# Patient Record
Sex: Female | Born: 1955 | Race: Black or African American | Hispanic: No | Smoking: Former smoker
Health system: Southern US, Community
[De-identification: ages and names within clinical notes are randomized; demographics above are authoritative.]

## PROBLEM LIST (undated history)

## (undated) DIAGNOSIS — R600 Localized edema: Secondary | ICD-10-CM

## (undated) DIAGNOSIS — M2550 Pain in unspecified joint: Secondary | ICD-10-CM

## (undated) DIAGNOSIS — R519 Headache, unspecified: Secondary | ICD-10-CM

## (undated) DIAGNOSIS — G4730 Sleep apnea, unspecified: Secondary | ICD-10-CM

## (undated) DIAGNOSIS — K219 Gastro-esophageal reflux disease without esophagitis: Secondary | ICD-10-CM

## (undated) DIAGNOSIS — R0602 Shortness of breath: Secondary | ICD-10-CM

## (undated) DIAGNOSIS — I10 Essential (primary) hypertension: Secondary | ICD-10-CM

## (undated) DIAGNOSIS — E739 Lactose intolerance, unspecified: Secondary | ICD-10-CM

## (undated) DIAGNOSIS — M79643 Pain in unspecified hand: Secondary | ICD-10-CM

## (undated) DIAGNOSIS — M25569 Pain in unspecified knee: Secondary | ICD-10-CM

## (undated) DIAGNOSIS — E669 Obesity, unspecified: Secondary | ICD-10-CM

## (undated) DIAGNOSIS — E559 Vitamin D deficiency, unspecified: Secondary | ICD-10-CM

## (undated) DIAGNOSIS — R12 Heartburn: Secondary | ICD-10-CM

## (undated) DIAGNOSIS — R079 Chest pain, unspecified: Secondary | ICD-10-CM

## (undated) DIAGNOSIS — E7800 Pure hypercholesterolemia, unspecified: Secondary | ICD-10-CM

## (undated) HISTORY — DX: Sleep apnea, unspecified: G47.30

## (undated) HISTORY — PX: BREAST SURGERY: SHX581

## (undated) HISTORY — DX: Essential (primary) hypertension: I10

## (undated) HISTORY — DX: Pain in unspecified joint: M25.50

## (undated) HISTORY — DX: Gastro-esophageal reflux disease without esophagitis: K21.9

## (undated) HISTORY — DX: Pain in unspecified knee: M25.569

## (undated) HISTORY — PX: CARPAL TUNNEL RELEASE: SHX101

## (undated) HISTORY — DX: Localized edema: R60.0

## (undated) HISTORY — DX: Chest pain, unspecified: R07.9

## (undated) HISTORY — DX: Vitamin D deficiency, unspecified: E55.9

## (undated) HISTORY — DX: Pain in unspecified hand: M79.643

## (undated) HISTORY — DX: Headache, unspecified: R51.9

## (undated) HISTORY — DX: Heartburn: R12

## (undated) HISTORY — DX: Shortness of breath: R06.02

## (undated) HISTORY — PX: PARATHYROIDECTOMY: SHX19

## (undated) HISTORY — DX: Lactose intolerance, unspecified: E73.9

## (undated) HISTORY — DX: Obesity, unspecified: E66.9

## (undated) HISTORY — DX: Pure hypercholesterolemia, unspecified: E78.00

## (undated) HISTORY — PX: ABDOMINAL HYSTERECTOMY: SHX81

---

## 2003-10-21 ENCOUNTER — Encounter: Admission: RE | Admit: 2003-10-21 | Discharge: 2003-10-21 | Payer: Self-pay | Admitting: Family Medicine

## 2004-03-27 ENCOUNTER — Ambulatory Visit: Payer: Self-pay | Admitting: Cardiology

## 2004-03-29 ENCOUNTER — Ambulatory Visit: Payer: Self-pay

## 2004-04-05 ENCOUNTER — Ambulatory Visit: Payer: Self-pay

## 2004-04-12 ENCOUNTER — Encounter: Admission: RE | Admit: 2004-04-12 | Discharge: 2004-04-12 | Payer: Self-pay | Admitting: Neurology

## 2004-11-26 ENCOUNTER — Encounter: Admission: RE | Admit: 2004-11-26 | Discharge: 2004-11-26 | Payer: Self-pay | Admitting: Internal Medicine

## 2005-11-28 ENCOUNTER — Encounter: Admission: RE | Admit: 2005-11-28 | Discharge: 2005-11-28 | Payer: Self-pay | Admitting: Internal Medicine

## 2006-12-09 ENCOUNTER — Encounter: Admission: RE | Admit: 2006-12-09 | Discharge: 2006-12-09 | Payer: Self-pay | Admitting: Obstetrics and Gynecology

## 2007-12-29 ENCOUNTER — Encounter: Admission: RE | Admit: 2007-12-29 | Discharge: 2007-12-29 | Payer: Self-pay | Admitting: Obstetrics and Gynecology

## 2008-12-29 ENCOUNTER — Encounter: Admission: RE | Admit: 2008-12-29 | Discharge: 2008-12-29 | Payer: Self-pay | Admitting: Obstetrics and Gynecology

## 2010-01-01 ENCOUNTER — Encounter: Admission: RE | Admit: 2010-01-01 | Discharge: 2010-01-01 | Payer: Self-pay | Admitting: Obstetrics and Gynecology

## 2010-10-23 ENCOUNTER — Other Ambulatory Visit: Payer: Self-pay | Admitting: Obstetrics and Gynecology

## 2010-10-23 DIAGNOSIS — Z1231 Encounter for screening mammogram for malignant neoplasm of breast: Secondary | ICD-10-CM

## 2011-01-04 ENCOUNTER — Ambulatory Visit: Payer: Self-pay

## 2011-01-08 ENCOUNTER — Ambulatory Visit: Payer: Self-pay

## 2011-01-14 ENCOUNTER — Ambulatory Visit
Admission: RE | Admit: 2011-01-14 | Discharge: 2011-01-14 | Disposition: A | Discharge status: Home / Self Care | Payer: No Typology Code available for payment source | Source: Ambulatory Visit | Attending: Obstetrics and Gynecology | Admitting: Obstetrics and Gynecology

## 2011-01-14 DIAGNOSIS — Z1231 Encounter for screening mammogram for malignant neoplasm of breast: Secondary | ICD-10-CM

## 2011-10-18 ENCOUNTER — Encounter: Payer: Self-pay | Admitting: *Deleted

## 2011-10-18 ENCOUNTER — Encounter: Payer: 59 | Attending: Internal Medicine | Admitting: *Deleted

## 2011-10-18 DIAGNOSIS — E669 Obesity, unspecified: Secondary | ICD-10-CM | POA: Insufficient documentation

## 2011-10-18 DIAGNOSIS — Z713 Dietary counseling and surveillance: Secondary | ICD-10-CM | POA: Insufficient documentation

## 2011-10-18 NOTE — Progress Notes (Signed)
  Medical Nutrition Therapy:  Appt start time: 0945 end time:  1045.  Assessment:  Primary concerns today: patient here for assistance with weight loss. 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 She lives alone except when her son who is a long distance trucker is home. She has been active at the [facility] but stopped going when she had a very bad cold. She has an appointment with a trainer next week to resume an exercise program there.  MEDICATIONS: see list   DIETARY INTAKE:  Usual eating pattern includes 3 meals and 1-2 snacks per day.  Everyday foods include good variety of all food groups.  Avoided foods include none stated.    24-hr recall:  B ( AM): brings to work: yogurt and fruit or granola bar OR biscuit or croissant with gravy or meat, occasionally coffee and hot chocolate mixed or hot tea with 2 pkgs Splenda Snk ( AM):  Occasionally PNB crackers or granola bar, water (3 bottels a day)  L ( PM): brings from home or brought in by drug reps: left overs, water or sweet tea Snk ( PM): not usually, occasionally M&M's or cake if brought in D ( PM): take out, or cooks: meat, starch, vegetable, water,  Snk ( PM): none (reflux, so no food right before bed) unless PNB crackers or yogurt Beverages: water, sweet tea, coffee with hot chocolate, Koolaid, unsweetened hot tea, regular soda infrequently, cranberry or mixed juices,   Usual physical activity: has joined [employer], quit when she got a bad cold, has appointment with trainer to get back on track  Estimated energy needs: 1400 calories 158 g carbohydrates 105 g protein 39 g fat  Progress Towards Goal(s):  In progress.   Nutritional Diagnosis:  NI-1.5 Excessive energy intake As related to activity level.  As evidenced by BMI of 42.5.    Intervention:  Nutrition counseling for weight loss initiated. Taught her the calorie content of the macro-nutrients, carb counting as a method of assessing her current  eating habits as well as managing her calorie intake better. Taught her how to read food labels and about resources to provide additional nutrition information about foods in restaurants. Encouraged her to follow through with her plans to increase activity level by going to the gym several days a week.  Plan: Aim for 2-3 carb choices per meal (30-45 grams) Aim for 0-1 Carb Choice per snack if hungry Consider reading food labels for total carb of foods Continue with plan to go to [facility] for assessment and a plan of action Consider purchasing Calorie KIng book for more nutrition information  Handouts given during visit include: Carb Counting and Food Label handouts Meal Plan Card Calorie Brooke [employer] Book information  Monitoring/Evaluation:  Dietary intake, exercise, reading food labels, and body weight in 4 week(s).

## 2011-10-18 NOTE — Patient Instructions (Addendum)
Plan: Aim for 2-3 carb choices per meal (30-45 grams) Aim for 0-1 Carb Choice per snack if hungry Consider reading food labels for total carb of foods Continue with plan to go to [facility] for assessment and a plan of action Consider purchasing Calorie KIng book for more nutrition information

## 2011-11-11 ENCOUNTER — Other Ambulatory Visit (HOSPITAL_COMMUNITY): Payer: Self-pay | Admitting: Obstetrics and Gynecology

## 2011-11-11 DIAGNOSIS — C8293 Follicular lymphoma, unspecified, intra-abdominal lymph nodes: Secondary | ICD-10-CM

## 2011-11-11 DIAGNOSIS — R140 Abdominal distension (gaseous): Secondary | ICD-10-CM

## 2011-11-14 ENCOUNTER — Ambulatory Visit (HOSPITAL_COMMUNITY)
Admission: RE | Admit: 2011-11-14 | Discharge: 2011-11-14 | Disposition: A | Discharge status: Home / Self Care | Payer: 59 | Source: Ambulatory Visit | Attending: Obstetrics and Gynecology | Admitting: Obstetrics and Gynecology

## 2011-11-14 DIAGNOSIS — R1904 Left lower quadrant abdominal swelling, mass and lump: Secondary | ICD-10-CM | POA: Insufficient documentation

## 2011-11-14 DIAGNOSIS — C8293 Follicular lymphoma, unspecified, intra-abdominal lymph nodes: Secondary | ICD-10-CM

## 2011-11-14 DIAGNOSIS — K429 Umbilical hernia without obstruction or gangrene: Secondary | ICD-10-CM | POA: Insufficient documentation

## 2011-11-14 MED ORDER — IOHEXOL 300 MG/ML  SOLN
100.0000 mL | Freq: Once | INTRAMUSCULAR | Status: AC | PRN
  Administered 2011-11-14: 100 mL via INTRAVENOUS

## 2011-11-21 ENCOUNTER — Encounter: Payer: 59 | Attending: Internal Medicine | Admitting: *Deleted

## 2011-11-21 ENCOUNTER — Encounter: Payer: Self-pay | Admitting: *Deleted

## 2011-11-21 DIAGNOSIS — Z713 Dietary counseling and surveillance: Secondary | ICD-10-CM | POA: Insufficient documentation

## 2011-11-21 DIAGNOSIS — E669 Obesity, unspecified: Secondary | ICD-10-CM | POA: Insufficient documentation

## 2011-11-21 NOTE — Progress Notes (Signed)
  Medical Nutrition Therapy:  Appt start time: 0845 end time:  0915.  Assessment:  Primary concerns today: patient here for assistance with weight loss follow up visit. Patient is pleased with 3 pound weight loss since last visit. She has met with the Trainer at the [facility] and has a plan for weight training as well as enjoying the spa after she works out. She still needs a plan for when she goes to gym as other commitments get in her way. She is happy with Carb Counting and is ready to add counting fat grams after today's visit.  MEDICATIONS: see list   DIETARY INTAKE:  Usual eating pattern includes 3 meals and 1-2 snacks per day.  Everyday foods include good variety of all food groups.  Avoided foods include none stated.    24-hr recall:  B ( AM): brings to work: yogurt and fruit or granola bar OR biscuit or croissant with gravy or meat, occasionally coffee and cut back on hot chocolate mixed or hot tea with 2 pkgs Splenda Snk ( AM):  Occasionally PNB crackers or granola bar, water (3 bottels a day)  L ( PM): brings from home or brought in by drug reps: left overs, no more fries if eats burger or chicken sandwich. water or sweet tea which she limits to 1 cup instead of several a day Snk ( PM): not usually, no longer M&M's or cake if brought in, has fresh fruit and a little protein from lunch D ( PM): take out, or cooks: meat, starch, vegetable, water, eating at home more often  Snk ( PM): none (reflux, so no food right before bed) unless PNB crackers or yogurt Beverages: water, sweet tea limited to 1 cup now, coffee occasionally with hot chocolate, Koolaid, unsweetened hot tea, regular soda infrequently, cranberry or mixed juices limited to 4 oz now,   Usual physical activity: has joined [employer], had appointment with trainer to get back on track. Still wants to come up with a schedule.  Estimated energy needs: 1400 calories 158 g carbohydrates 105 g protein 39 g fat  Progress Towards  Goal(s):  In progress.   Nutritional Diagnosis:  NI-1.5 Excessive energy intake As related to activity level.  As evidenced by BMI of 42.5.    Intervention:  Commended her on her successes with Carb Counting and weight loss. Discussed calorie content of fats and how to read fat grams on Food Labels. Suggested she aim for 3 Carb Choices and 3 Fat Choices per meal. Also discussed ways to get her to the gym at least once a week, by putting the planned time on her calendar.  Plan: Continue to aim for 2-3 carb choices per meal (30-45 grams) Continue to aim for 0-1 Carb Choice per snack if hungry Consider increasing awareness of fat in foods (5 grams = 1 serving of fat)  Aim for the same # of fat servings as you have Carb choices per meal Continue reading food labels for total carb and fat grams of foods Commit to Mondays every week to the gym, put date on your calendar Consider purchasing Calorie KIng book for more nutrition information  Monitoring/Evaluation:  Dietary intake, exercise, reading food labels, and body weight in 4 week(s).

## 2011-11-21 NOTE — Patient Instructions (Addendum)
Plan: Continue to aim for 2-3 carb choices per meal (30-45 grams) Continue to aim for 0-1 Carb Choice per snack if hungry Consider increasing awareness of fat in foods (5 grams = 1 serving of fat)  Aim for the same # of fat servings as you have Carb choices per meal Continue reading food labels for total carb and fat grams of foods Commit to Mondays every week to the gym, put date on your calendar Consider purchasing Calorie KIng book for more nutrition information

## 2011-12-06 ENCOUNTER — Other Ambulatory Visit: Payer: Self-pay | Admitting: Obstetrics and Gynecology

## 2011-12-06 DIAGNOSIS — Z1231 Encounter for screening mammogram for malignant neoplasm of breast: Secondary | ICD-10-CM

## 2011-12-11 ENCOUNTER — Encounter: Payer: Self-pay | Admitting: *Deleted

## 2011-12-19 ENCOUNTER — Ambulatory Visit: Payer: 59 | Admitting: *Deleted

## 2012-01-02 ENCOUNTER — Encounter: Payer: 59 | Attending: Internal Medicine | Admitting: *Deleted

## 2012-01-02 ENCOUNTER — Encounter: Payer: Self-pay | Admitting: *Deleted

## 2012-01-02 DIAGNOSIS — E669 Obesity, unspecified: Secondary | ICD-10-CM | POA: Insufficient documentation

## 2012-01-02 DIAGNOSIS — Z713 Dietary counseling and surveillance: Secondary | ICD-10-CM | POA: Insufficient documentation

## 2012-01-02 NOTE — Progress Notes (Signed)
  Medical Nutrition Therapy:  Appt start time: 1230 end time: 1300.  Assessment:  Primary concerns today: patient here for assistance with weight loss follow up visit. Knee pain is worse so no exercise possible right now. Still doing well with carb counting but states she needs help with understnading fat grams better. She is pleased with weight maintenance with decreased exercise.  MEDICATIONS: see list   DIETARY INTAKE:  Usual eating pattern includes 3 meals and 1-2 snacks per day.  Everyday foods include good variety of all food groups.  Avoided foods include none stated.    24-hr recall:  B ( AM): brings to work: yogurt and fruit or granola bar OR biscuit or croissant with gravy or meat, occasionally coffee and cut back on hot chocolate mixed or hot tea with 2 pkgs Splenda Snk ( AM):  Occasionally PNB crackers or granola bar, water (3 bottels a day)  L ( PM): brings from home or brought in by drug reps: left overs, no more fries if eats burger or chicken sandwich. water or sweet tea which she limits to 1 cup instead of several a day Snk ( PM): not usually, no longer M&M's or cake if brought in, has fresh fruit and a little protein from lunch D ( PM): take out, or cooks: meat, starch, vegetable, water, eating at home more often  Snk ( PM): none (reflux, so no food right before bed) unless PNB crackers or yogurt Beverages: water, sweet tea limited to 1 cup now, coffee occasionally with hot chocolate, Koolaid, unsweetened hot tea, regular soda infrequently, cranberry or mixed juices limited to 4 oz now,   Usual physical activity:has not been able to exercise with current knee pain  Estimated energy needs: 1400 calories 158 g carbohydrates 105 g protein 39 g fat  Progress Towards Goal(s):  In progress.   Nutritional Diagnosis:  NI-1.5 Excessive energy intake As related to activity level.  As evidenced by BMI of 42.5.    Intervention: Commended her on her success with carb counting  and reviewed concept of making decisions on fat gram serving sizes. Provided her with additional resources for identifying which foods have added fats and which ones don't. Also discussed options for increasing her activity level without hurting her knees. She seemed interested in water as well as Arm Chair exercises. Suggest she plan for weight maintenance through the holidays (no weight gain). Plan: Continue to aim for 2-3 carb choices per meal (30-45 grams) Continue to aim for 0-1 Carb Choice per snack if hungry Continue with awareness of fat in foods (5 grams = 1 serving of fat)  Aim for the same # of fat servings as you have Carb choices per meal Continue reading food labels for total carb and fat grams of foods Consider Arm Chair exercises as alternate activity until knees are better    Handouts provided:  [facility]  Arm Chair Exercise book resources  Carb Counting and Meal Planning Booklet by [employer]  Monitoring/Evaluation:  Dietary intake, exercise, reading food labels, and body weight in 4 week(s).

## 2012-01-02 NOTE — Patient Instructions (Addendum)
Plan: Continue to aim for 2-3 carb choices per meal (30-45 grams) Continue to aim for 0-1 Carb Choice per snack if hungry Continue with awareness of fat in foods (5 grams = 1 serving of fat)  Aim for the same # of fat servings as you have Carb choices per meal Continue reading food labels for total carb and fat grams of foods Consider Arm Chair exercises as alternate activity until knees are better

## 2012-01-15 ENCOUNTER — Ambulatory Visit
Admission: RE | Admit: 2012-01-15 | Discharge: 2012-01-15 | Disposition: A | Discharge status: Home / Self Care | Payer: 59 | Source: Ambulatory Visit | Attending: Obstetrics and Gynecology | Admitting: Obstetrics and Gynecology

## 2012-01-15 DIAGNOSIS — Z1231 Encounter for screening mammogram for malignant neoplasm of breast: Secondary | ICD-10-CM

## 2012-01-27 ENCOUNTER — Encounter: Payer: Self-pay | Admitting: *Deleted

## 2012-01-27 ENCOUNTER — Encounter: Payer: 59 | Attending: Internal Medicine | Admitting: *Deleted

## 2012-01-27 DIAGNOSIS — E669 Obesity, unspecified: Secondary | ICD-10-CM | POA: Insufficient documentation

## 2012-01-27 DIAGNOSIS — Z713 Dietary counseling and surveillance: Secondary | ICD-10-CM | POA: Insufficient documentation

## 2012-01-27 NOTE — Progress Notes (Signed)
  Medical Nutrition Therapy:  Appt start time: 1200 end time: 1230.  Assessment:  Primary concerns today: patient here for assistance with weight loss follow up visit. Weight is stable today with heavier clothes on. She states she attended a program recently that provided guidance on exercises for senior citizens and she was surprised at how her heart rate increased as she participated. She plans to continue these exercises daily to increase her activity level. She is comfortable with her carb counting, still struggling with how fat grams tie into her plan.  MEDICATIONS: see list   DIETARY INTAKE:  Usual eating pattern includes 3 meals and 1-2 snacks per day.  Everyday foods include good variety of all food groups.  Avoided foods include none stated.    24-hr recall:  B ( AM): brings to work: yogurt and fruit or granola bar OR biscuit or croissant with gravy or meat, occasionally coffee and cut back on hot chocolate mixed or hot tea with 2 pkgs Splenda Snk ( AM):  Occasionally PNB crackers or granola bar, water (3 bottels a day)  L ( PM): brings from home or brought in by drug reps: left overs, no more fries if eats burger or chicken sandwich. water or sweet tea which she limits to 1 cup instead of several a day Snk ( PM): not usually, no longer M&M's or cake if brought in, has fresh fruit and a little protein from lunch D ( PM): take out, or cooks: meat, starch, vegetable, water, eating at home more often  Snk ( PM): none (reflux, so no food right before bed) unless PNB crackers or yogurt Beverages: water, sweet tea limited to 1 cup now, coffee occasionally with hot chocolate, Koolaid, unsweetened hot tea, regular soda infrequently, cranberry or mixed juices limited to 4 oz now,   Usual physical activity:has not been able to exercise with current knee pain  Estimated energy needs: 1400 calories 158 g carbohydrates 105 g protein 39 g fat  Progress Towards Goal(s):  In  progress.   Nutritional Diagnosis:  NI-1.5 Excessive energy intake As related to activity level.  As evidenced by BMI of 42.5.    Intervention: Reviewed her food choices as related to meal plan, she is doing well overall. Quizzed her on her fat gram decisions to help her understand those better. She appears motivated to continue with arm chair type exercises on a daily basis.  Plan: Continue to aim for 2-3 carb choices per meal (30-45 grams) Continue to aim for 0-1 Carb Choice per snack if hungry Continue with awareness of fat in foods (5 grams = 1 serving of fat)  Aim for the same # of fat servings as you have Carb choices per meal Continue reading food labels for total carb and fat grams of foods Continue Arm Chair exercises daily that you learned at conference, increase time as able as alternate activity until knees are better   Monitoring/Evaluation:  Dietary intake, exercise, reading food labels, and body weight in 6 week(s).

## 2012-01-27 NOTE — Patient Instructions (Addendum)
Plan: Continue to aim for 2-3 carb choices per meal (30-45 grams) Continue to aim for 0-1 Carb Choice per snack if hungry Continue with awareness of fat in foods (5 grams = 1 serving of fat)  Aim for the same # of fat servings as you have Carb choices per meal Continue reading food labels for total carb and fat grams of foods Continue Arm Chair exercises daily that you learned at conference, increase time as able as alternate activity until knees are better

## 2012-03-09 ENCOUNTER — Ambulatory Visit: Payer: 59 | Admitting: *Deleted

## 2012-04-04 ENCOUNTER — Other Ambulatory Visit: Payer: Self-pay

## 2012-12-24 ENCOUNTER — Other Ambulatory Visit: Payer: Self-pay

## 2013-06-10 ENCOUNTER — Other Ambulatory Visit: Payer: Self-pay | Admitting: Nurse Practitioner

## 2013-06-10 DIAGNOSIS — Z1231 Encounter for screening mammogram for malignant neoplasm of breast: Secondary | ICD-10-CM

## 2013-06-23 ENCOUNTER — Ambulatory Visit
Admission: RE | Admit: 2013-06-23 | Discharge: 2013-06-23 | Disposition: A | Discharge status: Home / Self Care | Payer: BC Managed Care – PPO | Source: Ambulatory Visit | Attending: Nurse Practitioner | Admitting: Nurse Practitioner

## 2013-06-23 DIAGNOSIS — Z1231 Encounter for screening mammogram for malignant neoplasm of breast: Secondary | ICD-10-CM

## 2013-12-03 ENCOUNTER — Other Ambulatory Visit: Payer: Self-pay

## 2014-10-14 ENCOUNTER — Emergency Department (HOSPITAL_COMMUNITY): Payer: 59

## 2014-10-14 ENCOUNTER — Emergency Department (HOSPITAL_COMMUNITY)
Admission: EM | Admit: 2014-10-14 | Discharge: 2014-10-14 | Disposition: A | Discharge status: Home / Self Care | Payer: 59 | Attending: Emergency Medicine | Admitting: Emergency Medicine

## 2014-10-14 ENCOUNTER — Encounter (HOSPITAL_COMMUNITY): Payer: Self-pay | Admitting: *Deleted

## 2014-10-14 DIAGNOSIS — K219 Gastro-esophageal reflux disease without esophagitis: Secondary | ICD-10-CM | POA: Diagnosis not present

## 2014-10-14 DIAGNOSIS — Y9389 Activity, other specified: Secondary | ICD-10-CM | POA: Diagnosis not present

## 2014-10-14 DIAGNOSIS — Z79899 Other long term (current) drug therapy: Secondary | ICD-10-CM | POA: Diagnosis not present

## 2014-10-14 DIAGNOSIS — S52511A Displaced fracture of right radial styloid process, initial encounter for closed fracture: Secondary | ICD-10-CM | POA: Diagnosis not present

## 2014-10-14 DIAGNOSIS — Z7982 Long term (current) use of aspirin: Secondary | ICD-10-CM | POA: Diagnosis not present

## 2014-10-14 DIAGNOSIS — Y998 Other external cause status: Secondary | ICD-10-CM | POA: Diagnosis not present

## 2014-10-14 DIAGNOSIS — Y9289 Other specified places as the place of occurrence of the external cause: Secondary | ICD-10-CM | POA: Diagnosis not present

## 2014-10-14 DIAGNOSIS — Z87891 Personal history of nicotine dependence: Secondary | ICD-10-CM | POA: Insufficient documentation

## 2014-10-14 DIAGNOSIS — S6991XA Unspecified injury of right wrist, hand and finger(s), initial encounter: Secondary | ICD-10-CM | POA: Diagnosis present

## 2014-10-14 DIAGNOSIS — W010XXA Fall on same level from slipping, tripping and stumbling without subsequent striking against object, initial encounter: Secondary | ICD-10-CM | POA: Insufficient documentation

## 2014-10-14 DIAGNOSIS — E669 Obesity, unspecified: Secondary | ICD-10-CM | POA: Diagnosis not present

## 2014-10-14 DIAGNOSIS — I10 Essential (primary) hypertension: Secondary | ICD-10-CM | POA: Insufficient documentation

## 2014-10-14 MED ORDER — HYDROCODONE-ACETAMINOPHEN 5-325 MG PO TABS: 1.0000 | ORAL_TABLET | Freq: Four times a day (QID) | ORAL | Status: DC | PRN

## 2014-10-14 NOTE — Discharge Instructions (Signed)
It is important for you to follow up with the Hand Surgeon listed above.

## 2014-10-14 NOTE — ED Provider Notes (Signed)
CSN: [id number]     Arrival date & time 10/14/14  1829 History   First MD Initiated Contact with Patient 10/14/14 1841     Chief Complaint  Patient presents with  . Wrist Injury     (Consider location/radiation/quality/duration/timing/severity/associated sxs/prior Treatment) HPI Comments: Patient presents today with right wrist pain that has been constant since she tripped on a curb and fell around 3:30 PM today.  She states that she hand on an outstretched right hand when she fell.  She took three Advil immediately following the fall, which helped somewhat.  Denies hitting her head or LOC.  No other injuries.  Denies numbness or tingling.  She has some associated swelling of the right wrist.  She is left handed.  Patient is a 59 y.o. female presenting with wrist injury.  Wrist Injury   Past Medical History  Diagnosis Date  . GERD (gastroesophageal reflux disease)   . Hypertension   . Obesity    Past Surgical History  Procedure Laterality Date  . Abdominal hysterectomy    . Breast surgery      biopsy   No family history on file. Social History  Substance Use Topics  . Smoking status: Former Smoker    Quit date: 10/17/1985  . Smokeless tobacco: Never Used  . Alcohol Use: Yes     Comment: wine 4 times a year maybe   OB History    No data available     Review of Systems  All other systems reviewed and are negative.     Allergies  Review of patient's allergies indicates no known allergies.  Home Medications   Prior to Admission medications   Medication Sig Start Date End Date Taking? Authorizing Provider  aspirin 81 MG tablet Take 81 mg by mouth daily.    Historical Provider, MD  BLACK COHOSH PO Take by mouth.    Historical Provider, MD  hydrochlorothiazide (HYDRODIURIL) 25 MG tablet Take 25 mg by mouth daily.    Historical Provider, MD  ketorolac (TORADOL) 10 MG tablet Take 10 mg by mouth every 6 (six) hours as needed.    Historical Provider, MD  loratadine  (CLARITIN) 10 MG tablet Take 10 mg by mouth daily.    Historical Provider, MD  Nutritional Supplements (ESTROVEN MAXIMUM STRENGTH) TABS Take by mouth.    Historical Provider, MD  omeprazole (PRILOSEC) 20 MG capsule Take 20 mg by mouth daily.    Historical Provider, MD   BP 144/83 mmHg  Pulse 76  Temp(Src) 98.2 F (36.8 C) (Oral)  Resp 18  Ht 5\' 2"  (1.575 m)  Wt 232 lb (105.235 kg)  BMI 42.42 kg/m2  SpO2 99% Physical Exam  Constitutional: She appears well-developed and well-nourished.  HENT:  Head: Normocephalic and atraumatic.  Neck: Normal range of motion. Neck supple.  Cardiovascular: Normal rate, regular rhythm and normal heart sounds.   Pulses:      Radial pulses are 2+ on the right side, and 2+ on the left side.  Pulmonary/Chest: Effort normal and breath sounds normal.  Musculoskeletal:       Right elbow: She exhibits normal range of motion and no swelling. No tenderness found.       Right wrist: She exhibits decreased range of motion, tenderness, bony tenderness and swelling. She exhibits no deformity.       Right hand: Normal.  Neurological: She is alert.  Distal sensation of the right hand intact  Skin: Skin is warm and dry.  Psychiatric: She has a  normal mood and affect.  Nursing note and vitals reviewed.   ED Course  Procedures (including critical care time) Labs Review Labs Reviewed - No data to display  Imaging Review Dg Wrist Complete Right  10/14/2014   CLINICAL DATA:  Generalized right wrist pain and swelling. Trip and fall injury.  EXAM: RIGHT WRIST - COMPLETE 3+ VIEW  COMPARISON:  None.  FINDINGS: Degenerative changes in the STT joints and first and second carpometacarpal joints. Flattening and deformity of the scaphoid bone. This is likely degenerative. There is an acute oblique fracture across the radial styloid process with extension to the radiocarpal joint surface. Minimal displacement of the fracture fragment. Soft tissue swelling over the wrist.   IMPRESSION: Acute fracture across the radial styloid process with extension to the radiocarpal joint surface. Soft tissue swelling. Prominent degenerative changes in the carpus.   Electronically Signed   By: Lucienne Capers M.D.   On: 10/14/2014 19:00   I have personally reviewed and evaluated these images and lab results as part of my medical decision-making.   EKG Interpretation None      MDM   Final diagnoses:  None   Patient presents today with right wrist pain after falling on an outstretched hand.  Fall was mechanical.  No other injury.  Xray of wrist showing radial styloid process fracture with extension into the joint space.  Fracture closed.  Patient neurovascularly intact.  Sugar tong splint applied.  Patient given follow up with Hand Surgery.  Patient explained the importance of follow up.  Dr. Rex Kras also reviewed xray.  Patient stable for discharge.  Return precautions given.    Hyman Bible, PA-C 10/14/14 2212  Sharlett Iles, MD 10/15/14 [phone]

## 2014-10-14 NOTE — ED Notes (Signed)
Pt states she tripped and fell at 1530 today. Pain and swelling to right wrist area.

## 2015-06-28 ENCOUNTER — Other Ambulatory Visit: Payer: Self-pay

## 2015-06-28 DIAGNOSIS — Z1231 Encounter for screening mammogram for malignant neoplasm of breast: Secondary | ICD-10-CM

## 2015-07-31 ENCOUNTER — Ambulatory Visit: Payer: Self-pay

## 2015-08-07 ENCOUNTER — Ambulatory Visit
Admission: RE | Admit: 2015-08-07 | Discharge: 2015-08-07 | Disposition: A | Discharge status: Home / Self Care | Payer: BLUE CROSS/BLUE SHIELD | Source: Ambulatory Visit

## 2015-08-07 DIAGNOSIS — Z1231 Encounter for screening mammogram for malignant neoplasm of breast: Secondary | ICD-10-CM

## 2016-08-01 ENCOUNTER — Ambulatory Visit (INDEPENDENT_AMBULATORY_CARE_PROVIDER_SITE_OTHER): Payer: BLUE CROSS/BLUE SHIELD | Admitting: "Endocrinology

## 2016-08-01 ENCOUNTER — Encounter: Payer: Self-pay | Admitting: "Endocrinology

## 2016-08-01 ENCOUNTER — Other Ambulatory Visit: Payer: Self-pay

## 2016-08-01 DIAGNOSIS — Z6839 Body mass index (BMI) 39.0-39.9, adult: Secondary | ICD-10-CM | POA: Insufficient documentation

## 2016-08-01 LAB — RENAL FUNCTION PANEL
ALBUMIN: 4 g/dL (ref 3.6–5.1)
BUN: 14 mg/dL (ref 7–25)
CALCIUM: 10.6 mg/dL — AB (ref 8.6–10.4)
CO2: 27 mmol/L (ref 20–31)
Chloride: 107 mmol/L (ref 98–110)
Creat: 0.88 mg/dL (ref 0.50–0.99)
Glucose, Bld: 94 mg/dL (ref 65–99)
PHOSPHORUS: 3.5 mg/dL (ref 2.5–4.5)
Potassium: 4 mmol/L (ref 3.5–5.3)
SODIUM: 141 mmol/L (ref 135–146)

## 2016-08-01 LAB — T4, FREE: FREE T4: 1 ng/dL (ref 0.8–1.8)

## 2016-08-01 LAB — TSH: TSH: 2.23 m[IU]/L

## 2016-08-01 LAB — PHOSPHORUS: PHOSPHORUS: 3.5 mg/dL (ref 2.5–4.5)

## 2016-08-01 NOTE — Progress Notes (Signed)
HPI  Tina [name] is a 61 y.o.-year-old female, referred by her PCP, Dr.Shah, for evaluation for hypercalcemia/hyperparathyroidism.  Pt was dx with hypercalcemia for at least 2 years now. I reviewed pt's pertinent labs: - May,22,  2018: PTH 88 (normal 15-65), and calcium 11.0 - She does not have any bone density studies to review. No fractures or falls. She denies any history of seizure disorder. No h/o kidney stones. No h/o CKD.  her recent labs from May 23 showed normal renal function with BUN 12 and creatinine 0.91. Pt is on HCTZ, for hypertension. No h/o vitamin D deficiency. She does not have recent vitamin D level to review. Pt is not on calcium and vitamin D; she  is lactose intolerant does not consume a lot of dairy products . Pt does not have a FH of hypercalcemia, pituitary tumors, thyroid cancer, or osteoporosis.  - She denies any history of hight loss.  Past Medical History:  Diagnosis Date  . GERD (gastroesophageal reflux disease)   . Hypertension   . Obesity    Active Ambulatory Problems    Diagnosis Date Noted  . Hypercalcemia 08/01/2016  . Class 2 severe obesity due to excess calories with serious comorbidity and body mass index (BMI) of 39.0 to 39.9 in adult [facility]) 08/01/2016   Resolved Ambulatory Problems    Diagnosis Date Noted  . No Resolved Ambulatory Problems   Past Medical History:  Diagnosis Date  . GERD (gastroesophageal reflux disease)   . Hypertension   . Obesity     ROS:  Constitutional: + Steady weight, no fatigue, no subjective hyperthermia, no subjective hypothermia Eyes: no blurry vision, no xerophthalmia ENT: no sore throat, no nodules palpated in throat, no dysphagia/odynophagia, no hoarseness Cardiovascular: no Chest Pain, no Shortness of Breath, no palpitations, no leg swelling Respiratory: no cough, no SOB Gastrointestinal: no Nausea/Vomiting/Diarhhea Musculoskeletal: no muscle/joint aches Skin: no rashes Neurological: no tremors,  no numbness, no tingling, no dizziness Psychiatric: no depression, no anxiety  PE: BP 135/88   Pulse 66   Ht 5' 3.25" (1.607 m)   Wt 219 lb (99.3 kg)   BMI 38.49 kg/m  Wt Readings from Last 3 Encounters:  08/01/16 219 lb (99.3 kg)  10/14/14 232 lb (105.2 kg)  01/27/12 229 lb 11.2 oz (104.2 kg)   Constitutional: Obese,  not in acute distress, normal state of mind Eyes: PERRLA, EOMI, no exophthalmos ENT: moist mucous membranes, no thyromegaly, no cervical lymphadenopathy Cardiovascular: normal precordial activity, Regular Rate and Rhythm, no Murmur/Rubs/Gallops Respiratory:  adequate breathing efforts, no gross chest deformity, Clear to auscultation bilaterally Gastrointestinal: abdomen soft, Non -tender, No distension, Bowel Sounds present Musculoskeletal: no gross deformities, strength intact in all four extremities Skin: moist, warm, no rashes Neurological: no tremor with outstretched hands, Deep tendon reflexes normal in all four extremities.  On 07/09/2016: PTH 88 (normal 15-65); calcium 11.0    Assessment: 1. Hypercalcemia/hyperparathyroidism  Plan: Patient has had several instances of elevated calcium, with the highest level being at 11. A corresponding intact PTH level was also high, at 88.  - No apparent complications from hypercalcemia: no h/o nephrolithiasis, no osteoporosis, no fractures. No abdominal pain (other than related to diverticulitis/gastritis), depression, bone pain. - I discussed with the patient about the physiology of calcium and parathyroid hormone, and possible side effects from increased PTH, including kidney stones, osteoporosis, abdominal pain, etc.  - We discussed that we need to check whether her hyperparathyroidism is primary or secondary (to conditions like: vitamin D  deficiency, calcium malabsorption, hypercalciuria, renal insufficiency, etc.). -It is essential to rule out the rare  cause of hypercalcemia, FHH ( Familial  hypocalciuric  hypercalcemia)- which would not need any intervention.  - I'll proceed to obtain 24-hour urine calcium measurement, bone density, magnesium/phosphorus, 25-hydroxy vitamin D, and repeat PTH/calcium.  - If she is confirmed to have primary hyperparathyroidism , she is a surgical candidate.   - She will return in 4 weeks with her results to review and make treatment decision if necessary.   Glade Lloyd, MD

## 2016-08-02 LAB — VITAMIN D 25 HYDROXY (VIT D DEFICIENCY, FRACTURES): Vit D, 25-Hydroxy: 22 ng/mL — ABNORMAL LOW (ref 30–100)

## 2016-08-02 LAB — PTH, INTACT AND CALCIUM
Calcium: 10.6 mg/dL — ABNORMAL HIGH (ref 8.6–10.4)
PTH: 106 pg/mL — ABNORMAL HIGH (ref 14–64)

## 2016-08-02 LAB — MAGNESIUM: MAGNESIUM: 2.2 mg/dL (ref 1.5–2.5)

## 2016-08-07 ENCOUNTER — Other Ambulatory Visit: Payer: Self-pay | Admitting: "Endocrinology

## 2016-08-07 DIAGNOSIS — E213 Hyperparathyroidism, unspecified: Secondary | ICD-10-CM

## 2016-08-07 DIAGNOSIS — Z1231 Encounter for screening mammogram for malignant neoplasm of breast: Secondary | ICD-10-CM

## 2016-08-11 LAB — CALCIUM, URINE, 24 HOUR
Calcium, 24 hour urine: 366 mg/24 h — ABNORMAL HIGH (ref 35–250)
Calcium, Ur: 17 mg/dL

## 2016-09-06 ENCOUNTER — Other Ambulatory Visit: Payer: Self-pay

## 2016-09-06 ENCOUNTER — Ambulatory Visit
Admission: RE | Admit: 2016-09-06 | Discharge: 2016-09-06 | Disposition: A | Discharge status: Home / Self Care | Payer: BLUE CROSS/BLUE SHIELD | Source: Ambulatory Visit | Attending: "Endocrinology | Admitting: "Endocrinology

## 2016-09-06 DIAGNOSIS — E213 Hyperparathyroidism, unspecified: Secondary | ICD-10-CM

## 2016-09-06 DIAGNOSIS — Z1231 Encounter for screening mammogram for malignant neoplasm of breast: Secondary | ICD-10-CM

## 2016-09-16 ENCOUNTER — Encounter: Payer: Self-pay | Admitting: "Endocrinology

## 2016-09-16 ENCOUNTER — Ambulatory Visit (INDEPENDENT_AMBULATORY_CARE_PROVIDER_SITE_OTHER): Payer: BLUE CROSS/BLUE SHIELD | Admitting: "Endocrinology

## 2016-09-16 DIAGNOSIS — E210 Primary hyperparathyroidism: Secondary | ICD-10-CM | POA: Insufficient documentation

## 2016-09-16 NOTE — Progress Notes (Signed)
HPI  Tina [name] is a 60 y.o.-year-old female, referred by her PCP, Dr.Shah, for evaluation for hypercalcemia/hyperparathyroidism.  Pt was dx with hypercalcemia for at least 2 years now. I reviewed pt's pertinent labs: - May,22,  2018: PTH 88 (normal 15-65), and calcium 11.0 - Her repeat labs from 08/01/2016 showed calcium 10.6, PTH 106. -Her 24 hour urine calcium is elevated at 366. - She does  have bone density - done since her last visit showing osteopenia with T score of - 1.8 on her spine.  No fractures or falls. She denies any history of seizure disorder. No h/o kidney stones. No h/o CKD.  her recent labs from May 23 showed normal renal function with BUN 12 and creatinine 0.91. Pt is on HCTZ, for hypertension. No h/o vitamin D deficiency. Pt is not on calcium and vitamin D; she  is lactose intolerant does not consume a lot of dairy products . Pt does not have a FH of hypercalcemia, pituitary tumors, thyroid cancer, or osteoporosis.  - She denies any history of hight loss.  Past Medical History:  Diagnosis Date  . GERD (gastroesophageal reflux disease)   . Hypertension   . Obesity    Active Ambulatory Problems    Diagnosis Date Noted  . Hypercalcemia 08/01/2016  . Class 2 severe obesity due to excess calories with serious comorbidity and body mass index (BMI) of 39.0 to 39.9 in adult ([place]) 08/01/2016  . Hyperparathyroidism, primary ([place]) 09/16/2016   Resolved Ambulatory Problems    Diagnosis Date Noted  . No Resolved Ambulatory Problems   Past Medical History:  Diagnosis Date  . GERD (gastroesophageal reflux disease)   . Hypertension   . Obesity     ROS:  Constitutional: + Steady weight, no fatigue, no subjective hyperthermia, no subjective hypothermia Eyes: no blurry vision, no xerophthalmia ENT: no sore throat, no nodules palpated in throat, no dysphagia/odynophagia, no hoarseness Cardiovascular: no Chest Pain, no Shortness of Breath, no palpitations, no leg  swelling Respiratory: no cough, no SOB Gastrointestinal: no Nausea/Vomiting/Diarhhea Musculoskeletal: no muscle/joint aches Skin: no rashes Neurological: no tremors, no numbness, no tingling, no dizziness Psychiatric: no depression, no anxiety  PE: BP 121/85 (BP Location: Left Arm, Patient Position: Sitting, Cuff Size: Large)   Pulse 73   Ht 5' 3.25" (1.607 m)   Wt 220 lb 3.2 oz (99.9 kg)   SpO2 97%   BMI 38.70 kg/m  Wt Readings from Last 3 Encounters:  09/16/16 220 lb 3.2 oz (99.9 kg)  08/01/16 219 lb (99.3 kg)  10/14/14 232 lb (105.2 kg)   Constitutional: Obese,  not in acute distress, normal state of mind Eyes: PERRLA, EOMI, no exophthalmos ENT: moist mucous membranes, no thyromegaly, no cervical lymphadenopathy Cardiovascular: normal precordial activity, Regular Rate and Rhythm, no Murmur/Rubs/Gallops Respiratory:  adequate breathing efforts, no gross chest deformity, Clear to auscultation bilaterally Gastrointestinal: abdomen soft, Non -tender, No distension, Bowel Sounds present Musculoskeletal: no gross deformities, strength intact in all four extremities Skin: moist, warm, no rashes Neurological: no tremor with outstretched hands, Deep tendon reflexes normal in all four extremities.   Recent Results (from the past 2160 hour(s))  TSH     Status: None   Collection Time: 08/01/16  9:28 AM  Result Value Ref Range   TSH 2.23 mIU/L    Comment:   Reference Range   > or = 20 Years  0.40-4.50   Pregnancy Range First trimester  0.26-2.66 Second trimester 0.55-2.73 Third trimester  0.43-2.91  T4, free     Status: None   Collection Time: 08/01/16  9:28 AM  Result Value Ref Range   Free T4 1.0 0.8 - 1.8 ng/dL  PTH, intact and calcium     Status: Abnormal   Collection Time: 08/01/16  9:30 AM  Result Value Ref Range   PTH 106 (H) 14 - 64 pg/mL   Calcium 10.6 (H) 8.6 - 10.4 mg/dL    Comment:   Interpretive Guide:                              Intact PTH                Calcium                              ----------               ------- Normal Parathyroid           Normal                   Normal Hypoparathyroidism           Low or Low Normal        Low Hyperparathyroidism      Primary                 Normal or High           High      Secondary               High                     Normal or Low      Tertiary                High                     High Non-Parathyroid   Hypercalcemia              Low or Low Normal        High   Magnesium     Status: None   Collection Time: 08/01/16  9:30 AM  Result Value Ref Range   Magnesium 2.2 1.5 - 2.5 mg/dL  Phosphorus     Status: None   Collection Time: 08/01/16  9:30 AM  Result Value Ref Range   Phosphorus 3.5 2.5 - 4.5 mg/dL  VITAMIN D 25 Hydroxy (Vit-D Deficiency, Fractures)     Status: Abnormal   Collection Time: 08/01/16  9:30 AM  Result Value Ref Range   Vit D, 25-Hydroxy 22 (L) 30 - 100 ng/mL    Comment: Vitamin D Status           25-OH Vitamin D        Deficiency                <20 ng/mL        Insufficiency         20 - 29 ng/mL        Optimal             > or = 30 ng/mL   For 25-OH Vitamin D testing on patients on D2-supplementation and patients for whom quantitation of D2 and D3 fractions is required, the Quest[facility]D 25-OH VIT D, (D2,D3), LC/MS/MS is recommended: order  code [id number] (patients > 2 yrs).   Renal function panel     Status: Abnormal   Collection Time: 08/01/16  9:30 AM  Result Value Ref Range   Sodium 141 135 - 146 mmol/L   Potassium 4.0 3.5 - 5.3 mmol/L   Chloride 107 98 - 110 mmol/L   CO2 27 20 - 31 mmol/L   Glucose, Bld 94 65 - 99 mg/dL   BUN 14 7 - 25 mg/dL   Creat 0.88 0.50 - 0.99 mg/dL    Comment:   For patients > or = 61 years of age: The upper reference limit for Creatinine is approximately 13% higher for people identified as African-American.      Albumin 4.0 3.6 - 5.1 g/dL   Calcium 10.6 (H) 8.6 - 10.4 mg/dL   Phosphorus 3.5 2.5 - 4.5 mg/dL  Calcium,  urine, 24 hour     Status: Abnormal   Collection Time: 08/10/16 10:22 AM  Result Value Ref Range   Calcium, Ur 17 Not estab mg/dL   Calcium, 24 hour urine 366 (H) 35 - 250 mg/24 h    Comment:                                   Reference Range   35-250                                   Low calcium diet  35-200       On 07/09/2016: PTH 88 (normal 15-65); calcium 11.0   Assessment: 1. Hypercalcemia/hyperparathyroidism  Plan: Patient has had several instances of elevated calcium, with the highest level being at 11. A corresponding intact PTH level was also high, at 88.  -  since her last visit, repeat labs show PTH 106, calcium 10.6, 24 hour urine calcium high at 366, bone density shows osteopenia with spinal T score of -1.8. - No history of fractures. No abdominal pain (other than related to diverticulitis/gastritis), depression, bone pain. - I discussed with the patient about the physiology of calcium and parathyroid hormone, and possible side effects from increased PTH, including kidney stones, osteoporosis, abdominal pain, etc.  - She most likely has primary hyperparathyroidism. - She has early mild-primary hyperparathyroidism , she is a surgical candidate.  - I discussed and refer her to Dr. Armandina Gemma for evaluation and treatment.  - She does have mild vitamin D deficiency, will defer replacement after surgery.   - She will return in 8 weeks with   repeat PTH/calcium, thyroid function tests for reevaluation.  Glade Lloyd, MD

## 2016-10-28 ENCOUNTER — Telehealth: Payer: Self-pay

## 2016-10-28 NOTE — Telephone Encounter (Signed)
I called back, no answer; left message for her to call back.

## 2016-10-28 NOTE — Telephone Encounter (Signed)
Pt is requesting a call from [facility]. She is nervous about having surgery for hyperparathyroidism. And doesn't want to proceed until she speaks with Dr Dorris Fetch.

## 2016-10-30 ENCOUNTER — Other Ambulatory Visit (HOSPITAL_COMMUNITY): Payer: Self-pay | Admitting: Surgery

## 2016-10-30 DIAGNOSIS — E213 Hyperparathyroidism, unspecified: Secondary | ICD-10-CM

## 2016-11-22 ENCOUNTER — Encounter (HOSPITAL_COMMUNITY): Admission: RE | Admit: 2016-11-22 | Payer: BLUE CROSS/BLUE SHIELD | Source: Ambulatory Visit

## 2016-11-22 ENCOUNTER — Encounter (HOSPITAL_COMMUNITY)
Admission: RE | Admit: 2016-11-22 | Discharge: 2016-11-22 | Disposition: A | Discharge status: Home / Self Care | Payer: BLUE CROSS/BLUE SHIELD | Source: Ambulatory Visit | Attending: Surgery | Admitting: Surgery

## 2016-11-22 DIAGNOSIS — E213 Hyperparathyroidism, unspecified: Secondary | ICD-10-CM | POA: Insufficient documentation

## 2016-11-22 MED ORDER — TECHNETIUM TC 99M SESTAMIBI GENERIC - CARDIOLITE
25.0000 | Freq: Once | INTRAVENOUS | Status: AC | PRN
  Administered 2016-11-22: 25 via INTRAVENOUS

## 2016-12-04 ENCOUNTER — Other Ambulatory Visit: Payer: Self-pay | Admitting: Surgery

## 2016-12-04 DIAGNOSIS — E210 Primary hyperparathyroidism: Secondary | ICD-10-CM

## 2016-12-11 ENCOUNTER — Ambulatory Visit
Admission: RE | Admit: 2016-12-11 | Discharge: 2016-12-11 | Disposition: A | Discharge status: Home / Self Care | Payer: BLUE CROSS/BLUE SHIELD | Source: Ambulatory Visit | Attending: Surgery | Admitting: Surgery

## 2016-12-11 DIAGNOSIS — E210 Primary hyperparathyroidism: Secondary | ICD-10-CM

## 2016-12-17 ENCOUNTER — Other Ambulatory Visit: Payer: Self-pay | Admitting: Surgery

## 2016-12-17 DIAGNOSIS — E210 Primary hyperparathyroidism: Secondary | ICD-10-CM

## 2016-12-20 ENCOUNTER — Ambulatory Visit
Admission: RE | Admit: 2016-12-20 | Discharge: 2016-12-20 | Disposition: A | Discharge status: Home / Self Care | Payer: BLUE CROSS/BLUE SHIELD | Source: Ambulatory Visit | Attending: Surgery | Admitting: Surgery

## 2016-12-20 DIAGNOSIS — E210 Primary hyperparathyroidism: Secondary | ICD-10-CM

## 2016-12-20 MED ORDER — IOPAMIDOL (ISOVUE-300) INJECTION 61%
75.0000 mL | Freq: Once | INTRAVENOUS | Status: AC | PRN
  Administered 2016-12-20: 75 mL via INTRAVENOUS

## 2016-12-25 ENCOUNTER — Ambulatory Visit: Payer: Self-pay | Admitting: Surgery

## 2016-12-25 ENCOUNTER — Encounter: Payer: Self-pay | Admitting: "Endocrinology

## 2018-03-30 ENCOUNTER — Ambulatory Visit (INDEPENDENT_AMBULATORY_CARE_PROVIDER_SITE_OTHER): Payer: Self-pay | Admitting: Otolaryngology

## 2018-03-30 DIAGNOSIS — H6983 Other specified disorders of Eustachian tube, bilateral: Secondary | ICD-10-CM

## 2018-03-30 DIAGNOSIS — H900 Conductive hearing loss, bilateral: Secondary | ICD-10-CM

## 2018-03-30 DIAGNOSIS — H6121 Impacted cerumen, right ear: Secondary | ICD-10-CM

## 2018-04-30 ENCOUNTER — Ambulatory Visit (INDEPENDENT_AMBULATORY_CARE_PROVIDER_SITE_OTHER): Payer: Self-pay | Admitting: Otolaryngology

## 2018-04-30 DIAGNOSIS — H6983 Other specified disorders of Eustachian tube, bilateral: Secondary | ICD-10-CM

## 2018-08-08 IMAGING — US US THYROID
1 series · 13 of 25 positions shown · non-contrast
Comparison: Parathyroid SPECT imaging 11/22/2016

CLINICAL DATA: Primary hyperparathyroidism.

EXAM:
THYROID ULTRASOUND
TECHNIQUE: Ultrasound examination of the thyroid gland and adjacent soft
tissues was performed.

[Series 1: us thyroid · 0.06mm/px · 13 of 55 slices shown]
[im 1/55]
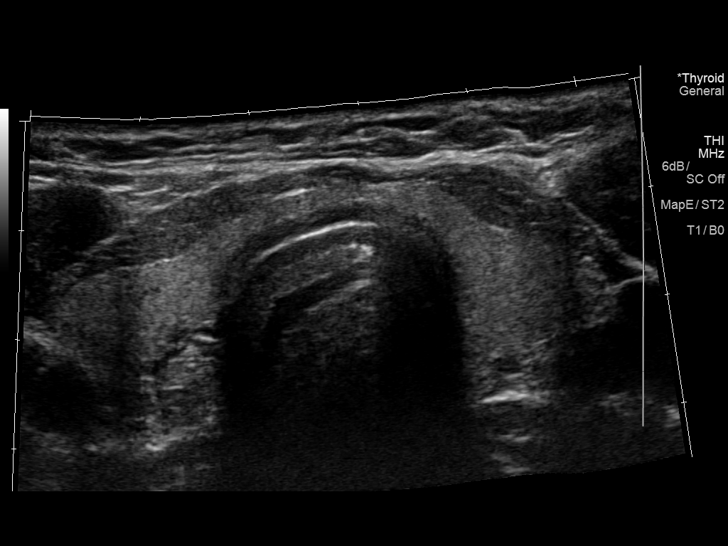
[im 5/55]
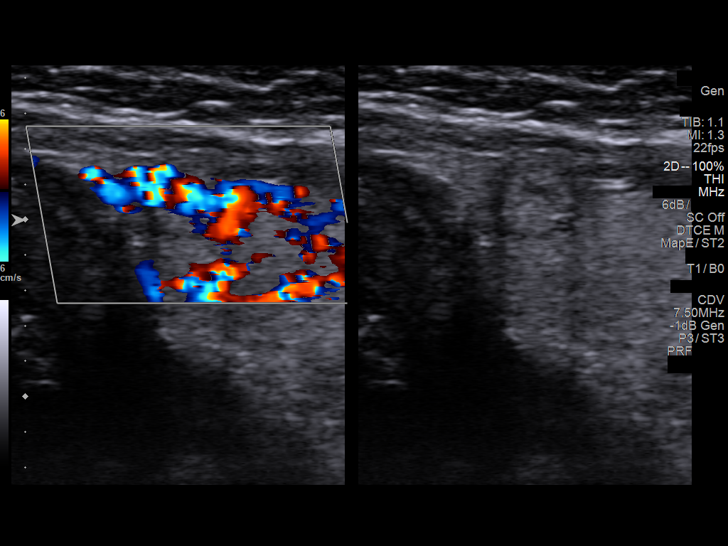
[im 10/55]
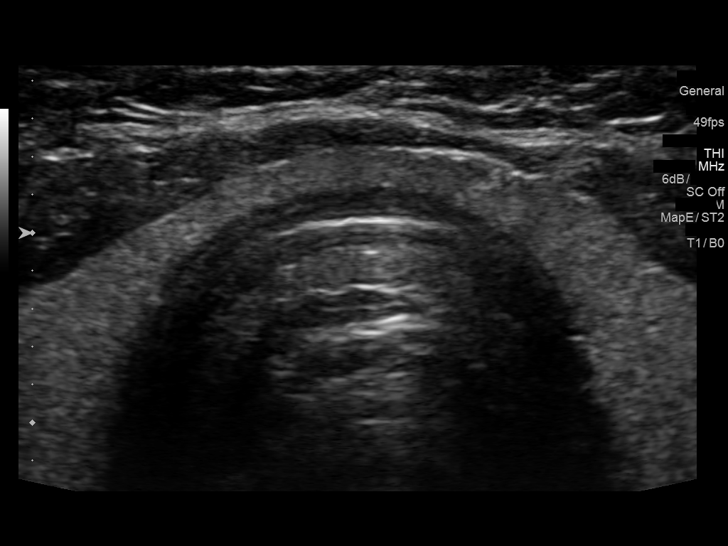
[im 14/55]
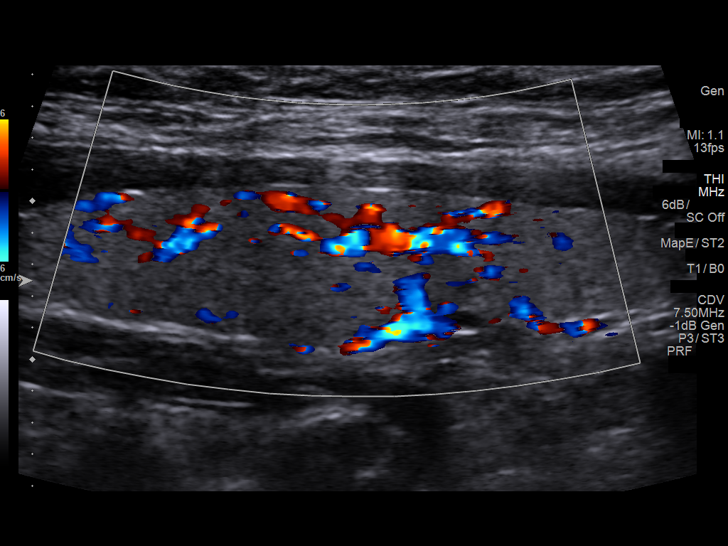
[im 19/55]
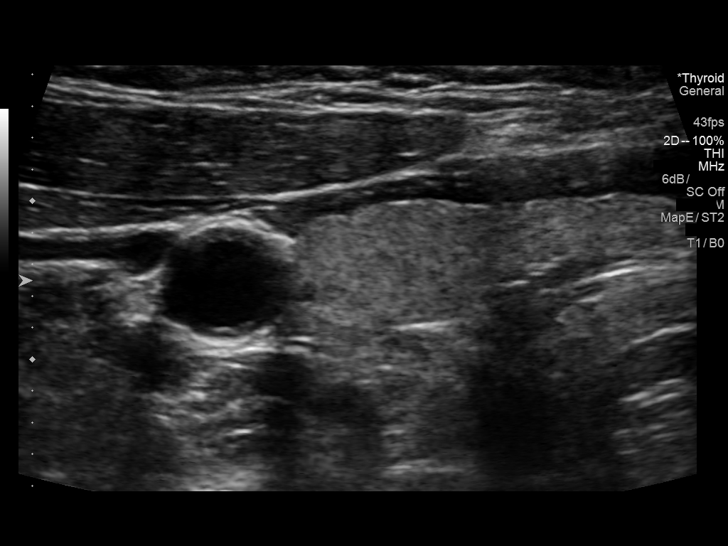
[im 23/55]
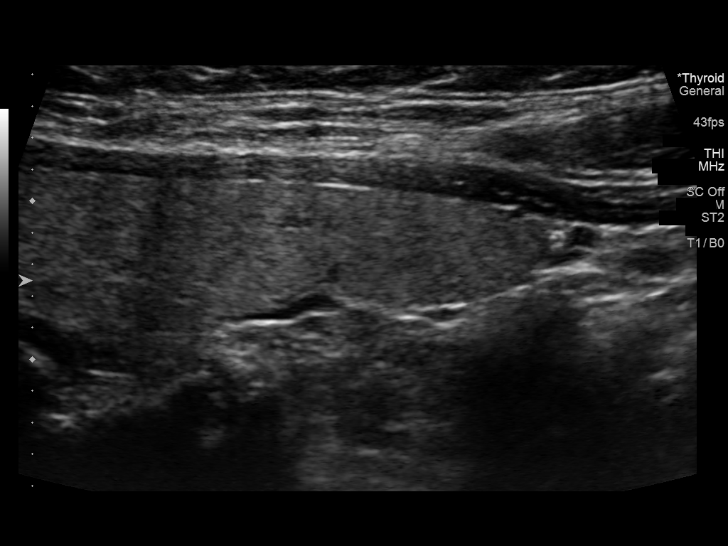
[im 28/55]
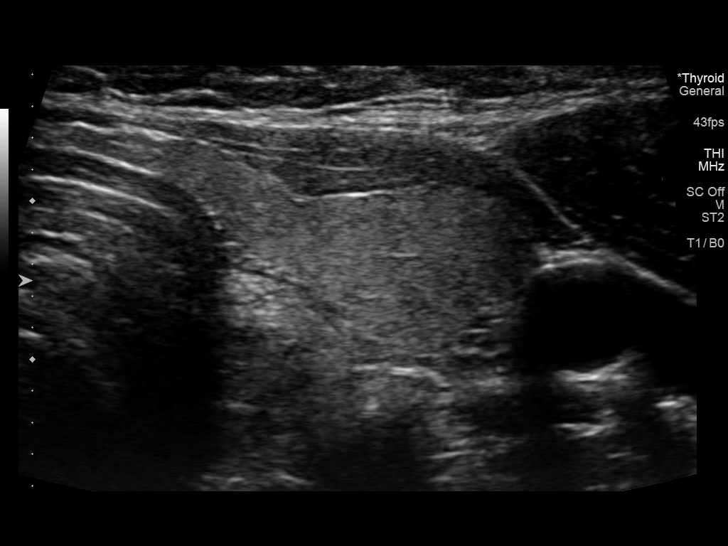
[im 32/55]
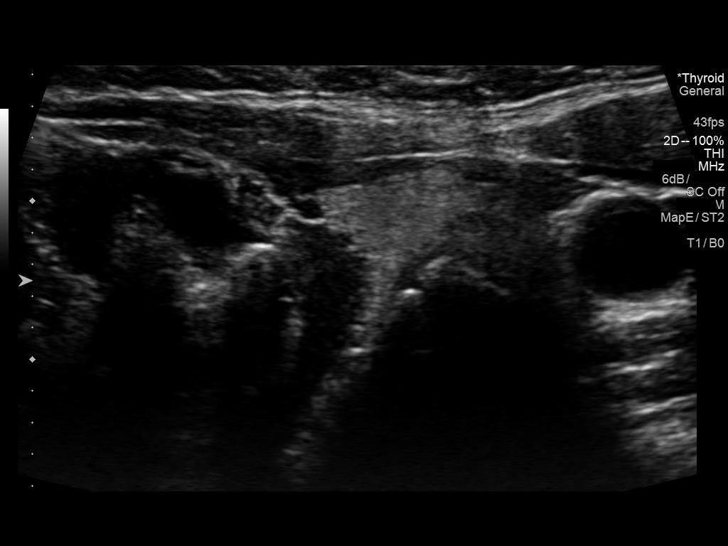
[im 37/55]
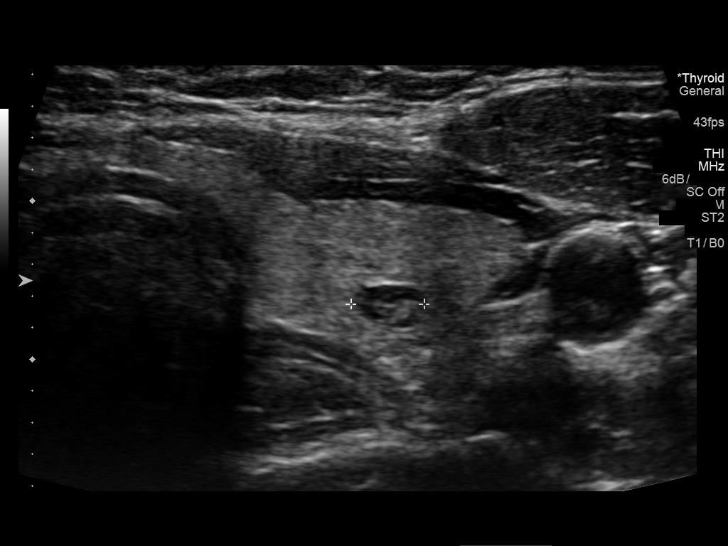
[im 41/55]
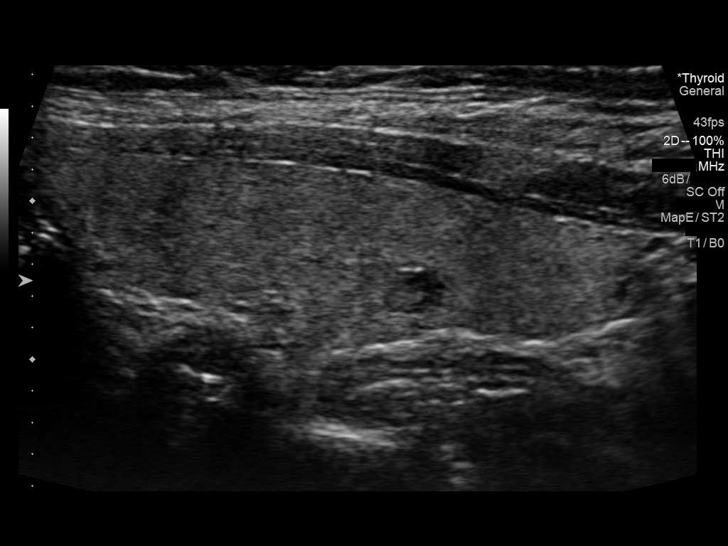
[im 46/55]
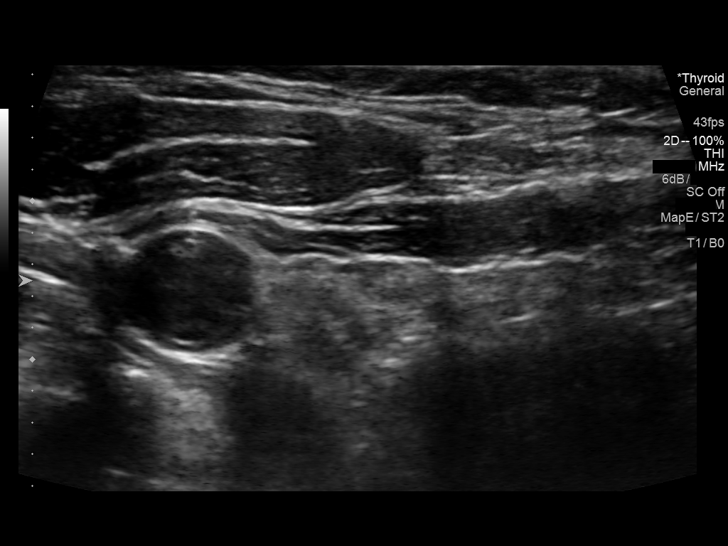
[im 50/55]
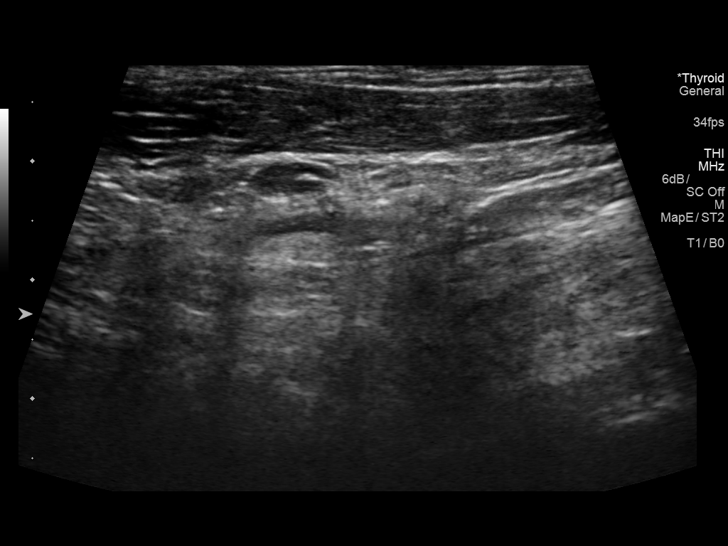
[im 55/55]
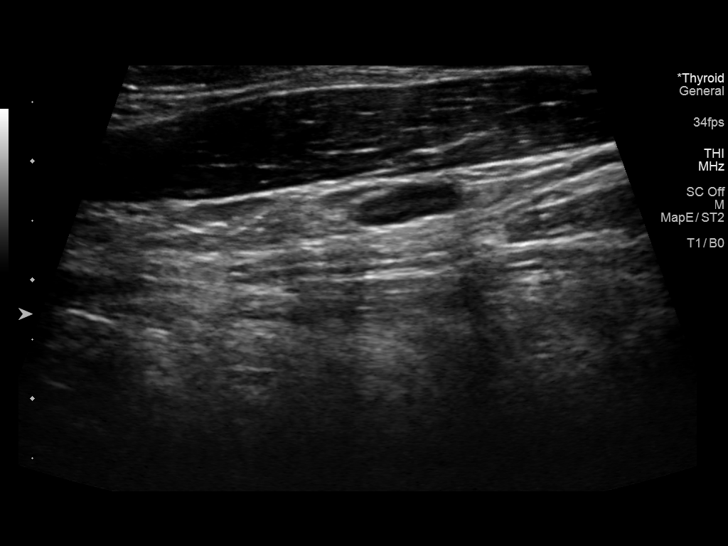

[13 of 25 positions shown; findings below may reference images not displayed]

FINDINGS: Parenchymal Echotexture: Mildly heterogenous

Isthmus: 0.2 cm

Right lobe: 4.4 x 1.2 x 1.9 cm

Left lobe: 4.2 x  1.5 x 1.9 cm

_________________________________________________________

Estimated total number of nodules >/= 1 cm: 0

Number of spongiform nodules >/=  2 cm not described below (TR1): 0

Number of mixed cystic and solid nodules >/= 1.5 cm not described
below (TR2): 0

_________________________________________________________

Nodule # 1:

Location: Isthmus; Mid; left

Maximum size: 0.5 cm; Other 2 dimensions: 0.5 x 0.5 cm

Composition: solid/almost completely solid (2)

Echogenicity: isoechoic (1)

Shape: not taller-than-wide (0)

Margins: ill-defined (0)

Echogenic foci: none (0)

ACR TI-RADS total points: 3.

ACR TI-RADS risk category: TR3 (3 points).

ACR TI-RADS recommendations:

Given size (<1.4 cm) and appearance, this nodule does NOT meet
TI-RADS criteria for biopsy or dedicated follow-up.

_________________________________________________________

Subcentimeter nodules in left thyroid lobe that do not meet criteria
for biopsy or dedicated follow-up.

No suspicious extrathyroidal nodules. No suspicious lymphadenopathy.
Largest lymph node measures 0.7 cm in the short axis along the left
side of the neck.
IMPRESSION: Small thyroid nodules.  These do not meet criteria for biopsy.

No suspicious lymphadenopathy or extrathyroidal nodules.

The above is in keeping with the ACR TI-RADS recommendations - [HOSPITAL] 2958;[DATE].

## 2019-01-03 IMAGING — CT CT NECK SOFT TISSUE WO/W CM
2 of 7 series · 4 of 14 positions shown, 5 images · IV contrast (APPLIED)
Comparison: None.

CLINICAL DATA: 60-year-old female with hyperparathyroidism.
Negative nuclear sestamibi scan last month.

Creatinine was obtained on site at [HOSPITAL] at [HOSPITAL]. Results: Creatinine 0.8 mg/dL.
EXAM:
CT NECK WITH AND WITHOUT CONTRAST
TECHNIQUE: Multidetector CT imaging of the neck was performed without and with
intravenous contrast.
CONTRAST:  75mL N5UFIL-SRR IOPAMIDOL (N5UFIL-SRR) INJECTION 61%

[Series 5: (id) · axial · 0.45mm/px · z∈[+766,+829]mm · 2 of 316 slices shown, 3 images (1 of 2)]
[im 106/316  soft-tissue]
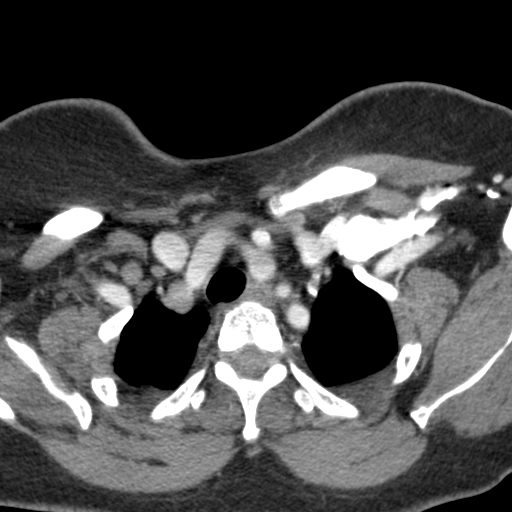
[im 106/316  bone]
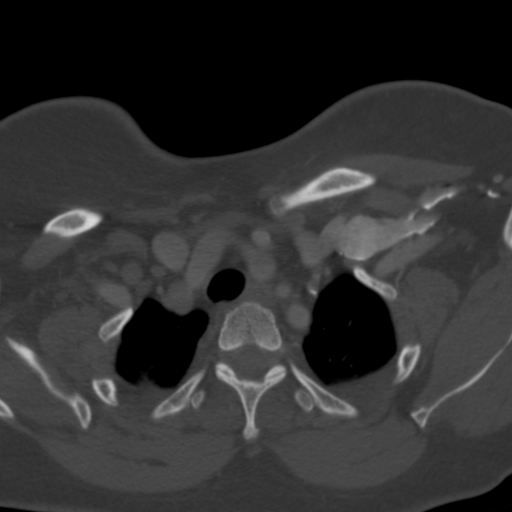
[im 211/316  bone]
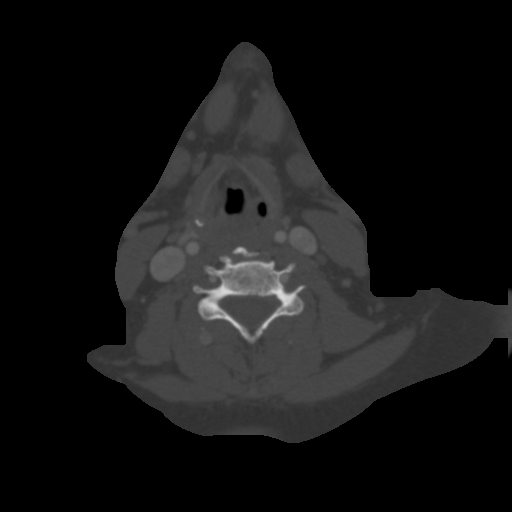

[Series 6: (id) · axial · 0.45mm/px · z∈[+766,+829]mm · 2 of 316 slices shown (2 of 2)]
[im 106/316  bone]
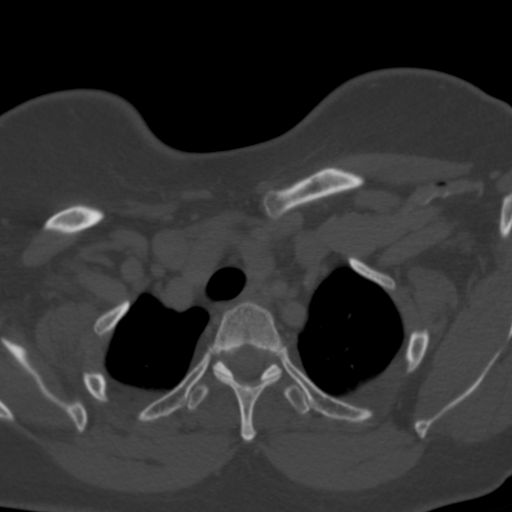
[im 211/316  bone]
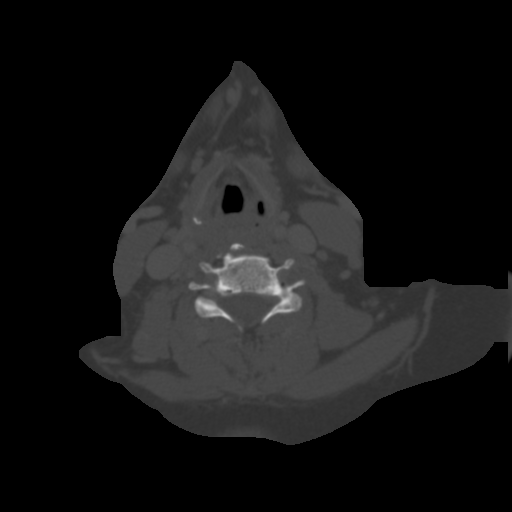

[4 of 14 positions shown; findings below may reference images not displayed]

FINDINGS: Pharynx and larynx: Laryngeal and pharyngeal contours are within
normal limits. Negative visible parapharyngeal and retropharyngeal
spaces.

Salivary glands: Sublingual space, submandibular glands, and parotid
glands are within normal limits.

Thyroid: Located near the left thoracic inlet just posterior and
lateral to the left thyroid lobe is a hyperenhancing rhomboid nodule
measuring 10 x 10 x 12 mm (AP by transverse by CC) . This is
immediately posterior to the left common carotid artery (series 5,
image 181) and immediately medial to the left IJ (series 11, image
64). The nodule hyper enhances relative to lymph nodes and has
precontrast and venous contrast density unlike thyroid parenchyma.
There might be a small left superolateral polar vessel associated
with the lesion.

The nearby thyroid parenchyma is normal for age; a subcentimeter
left lobe nodule on series 5, image 174 does not meet size criteria
for ultrasound follow-up.

Lymph nodes: Negative.  No cervical lymphadenopathy.

Vascular: Major vascular structures in the neck are patent.

Limited intracranial: Negative.

Mastoids and visualized paranasal sinuses: Minimally included,
clear.

Skeleton: Bulky C5-C6 left anterolateral endplate osteophytosis.
Lesser degenerative endplate spurring elsewhere in the cervical
spine. No acute osseous abnormality identified.

Upper chest: Negative visualized superior mediastinum.
IMPRESSION: 1. Left-side parathyroid adenoma 10 x 12 mm located immediately
posterior to the left CCA and immediately medial to the left IJ near
the thoracic inlet.
2. Cervical spine degeneration with bulky endplate osteophytosis.

## 2020-04-27 ENCOUNTER — Other Ambulatory Visit: Payer: Self-pay | Admitting: Internal Medicine

## 2020-04-27 DIAGNOSIS — Z0000 Encounter for general adult medical examination without abnormal findings: Secondary | ICD-10-CM

## 2020-05-06 LAB — COLOGUARD: COLOGUARD: NEGATIVE

## 2020-07-18 ENCOUNTER — Other Ambulatory Visit: Payer: Self-pay

## 2020-07-18 ENCOUNTER — Ambulatory Visit
Admission: RE | Admit: 2020-07-18 | Discharge: 2020-07-18 | Disposition: A | Discharge status: Home / Self Care | Payer: 59 | Source: Ambulatory Visit | Attending: Internal Medicine | Admitting: Internal Medicine

## 2020-07-18 DIAGNOSIS — Z0000 Encounter for general adult medical examination without abnormal findings: Secondary | ICD-10-CM

## 2021-03-28 LAB — HM HEPATITIS C SCREENING LAB: HM Hepatitis Screen: NEGATIVE

## 2021-06-08 ENCOUNTER — Telehealth: Payer: Self-pay | Admitting: Neurology

## 2021-06-14 ENCOUNTER — Ambulatory Visit: Payer: Medicare Other | Admitting: Neurology

## 2021-06-14 ENCOUNTER — Encounter: Payer: Self-pay | Admitting: Neurology

## 2021-06-14 VITALS — BP 138/84 | HR 76 | Ht 62.0 in | Wt 231.0 lb

## 2021-06-14 DIAGNOSIS — G4719 Other hypersomnia: Secondary | ICD-10-CM

## 2021-06-14 DIAGNOSIS — Z6839 Body mass index (BMI) 39.0-39.9, adult: Secondary | ICD-10-CM

## 2021-06-14 DIAGNOSIS — F5104 Psychophysiologic insomnia: Secondary | ICD-10-CM

## 2021-06-14 DIAGNOSIS — E210 Primary hyperparathyroidism: Secondary | ICD-10-CM | POA: Diagnosis not present

## 2021-06-14 DIAGNOSIS — R5382 Chronic fatigue, unspecified: Secondary | ICD-10-CM

## 2021-06-14 NOTE — Addendum Note (Signed)
Addended by: Larey Seat on: 06/14/2021 04:02 PM ? ? Modules accepted: Orders ? ?

## 2021-06-14 NOTE — Progress Notes (Signed)
? ? ?[facility] ?  ? ?Provider:  Larey Seat, MD  ?Primary Care Physician:  Leeanne Rio, MD ?[address] [place] [address] ?[place] [address]  ? ?  ?Referring Provider: Leeanne Rio, Md ?[address] ?[address] ?[place],  [place] [address]  ?  ?  ?    ?Chief Complaint according to patient   ?Patient presents with:  ?  ? New Patient (Initial Visit)  ?     ?  ?  ?HISTORY OF PRESENT ILLNESS:  ?Tina [name] is a 66 y.o. African American female patient seen here on 06/14/2021 .  ?Chief concern according to patient :  Pt referred for snoring, AM headaches.  ?Hx of OSA but could not tolerate cpap (over 18yr ago). Last SS over 12 years ago. 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  ? Yaretsi L JTorgeson has a past medical history of GERD (gastroesophageal reflux disease), Hypertension, and Obesity. Hyperparathyroidism,  and ectomy 11-2020. Hypercalcemia and uremia.  ? OSA in 2010, CPAP intolerant.  ROS : Dizziness 9 now resolved ) . Menopausal hot flushes. Hysterectomy.  ?Sleep relevant medical history: Nocturia 2-3 times , Obesity, snoring. Hypersomnia, EDS-  no sleep walking , no concussion, TBI.   ? ?Family medical /sleep history: NO other family member on CPAP with OSA, insomnia, sleep walkers.  ?  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  ?The patient used to work in shifts( nPresenter, broadcasting) ?Pets are not present. ?Tobacco use- former smoker and vapor user.   ?ETOH use ; rare , Caffeine intake in form of Coffee( 3 a week) Soda( /) Tea ( when eating out ). ?Regular exercise in form of walking.   ?Hobbies :cooking, baking  ? ?  ?  ?Sleep habits are as follows: The patient's dinner time is between 5-6 PM. The patient goes to bed at 10-11 PM and continues to struggle to sleep for 0.5-2 hours,  ?If  she fall asleep, she may wake up 20 -3 times wakes for bathroom breaks, from GERD, by  sternal pain, knee pain.  ?The preferred sleep position is prone , with the support of 1-2 pillows.  ?Dreams are reportedly frequent/vivid.  ? 5.30 AM is the usual rise time. The patient wakes up spontaneously at 5 AM . ? She reports mostly not feeling refreshed or restored in AM, with symptoms such as dry mouth, morning headaches, and residual fatigue. Naps are taken frequently, lasting from 12- to 2 PM-  1- 2 hours and feeling refreshed ..  ?  ?Review of Systems: ?Out of a complete 14 system review, the patient complains of only the following symptoms, and all other reviewed systems are negative.:  ?Fatigue, sleepiness , snoring, fragmented sleep, Insomnia  ? ?Still snoring? GERD. Nocturia.  ? ?SOB, palpitations,  ? ?swallowing problems ?Tinnitus  ? ?Headaches  anytime. Pain is shooting- stabbing, short and intense.  Usually not triggered.  ? ?  ?How likely are you to doze in the following situations: ?0 = not likely, 1 = slight chance, 2 = moderate chance, 3 = high chance ?  ?Sitting and Reading? ?Watching Television? ?Sitting inactive in a public place (theater or meeting)? ?As a passenger in a car for an hour without a break? ?Lying down in the afternoon when circumstances permit? ?Sitting and talking to  someone? ?Sitting quietly after lunch without alcohol? ?In a car, while stopped for a few minutes in traffic? ?  ?Total = 19/ 24 points  ? FSS endorsed at 57/ 63 points.  ? ?GDS 8 / 15 clinical depression index.  ? ?Social History  ? ?Socioeconomic History  ? Marital status: Divorced  ?  Spouse name: Not on file  ? Number of children: 2  ? Years of education: Not on file  ? Highest education level: Associate degree: academic program  ?Occupational History  ? Not on file  ?Tobacco Use  ? Smoking status: Former  ?  Types: Cigarettes  ?  Quit date: 10/17/1985  ?  Years since quitting: 35.6  ? Smokeless tobacco: Never  ?Vaping Use  ? Vaping Use: Never used  ?Substance and Sexual Activity  ? Alcohol use: Yes  ?   Comment: wine 4 times a year maybe  ? Drug use: Never  ? Sexual activity: Not on file  ?Other Topics Concern  ? Not on file  ?Social History Narrative  ? Lives alone  ? L handed  ? Caffeine: 1 C of Coffee a day occas  ? ?Social Determinants of Health  ? ?Financial Resource Strain: Not on file  ?Food Insecurity: Not on file  ?Transportation Needs: Not on file  ?Physical Activity: Not on file  ?Stress: Not on file  ?Social Connections: Not on file  ? ? ?Family History  ?Problem Relation Age of Onset  ? Stomach cancer Mother   ? Kidney failure Father   ? Breast cancer Neg Hx   ? ? ?Past Medical History:  ?Diagnosis Date  ? GERD (gastroesophageal reflux disease)   ? Hypertension   ? Obesity ? ?Parathyroidism ? ? ?OSA- CPAP not tolerated ?   ? ? ?Past Surgical History:  ?Procedure Laterality Date  ? ABDOMINAL HYSTERECTOMY    ? BREAST SURGERY    ? biopsy  ?  ? ?Current Outpatient Medications on File Prior to Visit  ?Medication Sig Dispense Refill  ? aspirin 81 MG tablet Take 81 mg by mouth daily.    ? BLACK COHOSH PO Take by mouth.    ? esomeprazole (NEXIUM) 40 MG capsule Take 40 mg by mouth daily at 12 noon.    ? naproxen (NAPROSYN) 250 MG tablet Take by mouth as needed.     ? ?No current facility-administered medications on file prior to visit.  ? ? ?Physical exam: ? ?Today's Vitals  ? 06/14/21 1511  ?BP: 138/84  ?Pulse: 76  ?Weight: 231 lb (104.8 kg)  ?Height: '5\' 2"'$  (1.575 m)  ? ?Body mass index is 42.25 kg/m?.  ? ?Wt Readings from Last 3 Encounters:  ?06/14/21 231 lb (104.8 kg)  ?09/16/16 220 lb 3.2 oz (99.9 kg)  ?08/01/16 219 lb (99.3 kg)  ?  ? ?Ht Readings from Last 3 Encounters:  ?06/14/21 '5\' 2"'$  (1.575 m)  ?09/16/16 5' 3.25" (1.607 m)  ?08/01/16 5' 3.25" (1.607 m)  ?  ?  ?General: The patient is awake, alert and appears not in acute distress. The patient is well groomed. ?Head: Normocephalic, atraumatic.  ?Neck is supple. Mallampati 1-2,  ?neck circumference:15.5  inches . Nasal airflow  patent.  Retrognathia is   seen.  ?Dental status:  ?Cardiovascular:  Regular rate and cardiac rhythm by pulse,   ?without distended neck veins. ?Respiratory: Lungs are clear to auscultation.  ?Skin:  With evidence of ankle edema, but no  rash. ?Trunk: The patient's posture is erect. ?  ?  Neurologic exam : ?The patient is awake and alert, oriented to place and time.   ?Memory subjective described as intact.  ?Attention span & concentration ability appears normal.  ?Speech is fluent,  without  dysarthria, dysphonia or aphasia.  ?Mood and affect are appropriate. ?  ?Cranial nerves: no loss of smell or taste reported  ?Pupils are equal and briskly reactive to light. Funduscopic exam deferred. Marland Kitchen  ?Extraocular movements in vertical and horizontal planes were intact and without nystagmus. No Diplopia. ?Visual fields by finger perimetry are intact. ?Hearing was intact to soft voice and finger rubbing.  Tinnitus in both ears - high pitched, non pulsatile  ? Facial sensation intact to fine touch. ? Facial motor strength is symmetric and tongue and uvula move midline.  ?Neck ROM : rotation, tilt and flexion extension were normal for age and shoulder shrug was symmetrical.  ?  ?Motor exam:  Symmetric bulk, tone and ROM.   ?Normal tone without cog- wheeling, symmetric grip strength . ?  ?Sensory:  Fine touch and vibration were tested  and  normal.  ?Proprioception tested in the upper extremities was normal. ?  ?Coordination: Rapid alternating movements in the fingers/hands were of normal speed.  ?The Finger-to-nose maneuver was intact without evidence of ataxia, dysmetria or tremor. ?  ?Gait and station: Patient could rise unassisted from a seated position, walked without assistive device.  ?Stance is of normal width/ base and the patient turned with 3.5 steps.  ?Toe and heel walk were deferred.  ?Deep tendon reflexes: in the  upper and lower extremities are symmetric and intact.  ?Babinski response was deferred . ?  ?  ?  ?After spending a total time of   45  minutes face to face and additional time for physical and neurologic examination, review of laboratory studies,  ?personal review of imaging studies, reports and results of other testing and review of re

## 2021-06-14 NOTE — Patient Instructions (Addendum)
Fatigue ?If you have fatigue, you feel tired all the time and have a lack of energy or a lack of motivation. Fatigue may make it difficult to start or complete tasks because of exhaustion. ?Occasional or mild fatigue is often a normal response to activity or life. However, long-term (chronic) or extreme fatigue may be a symptom of a medical condition such as: ?Depression. ?Not having enough red blood cells or hemoglobin in the blood (anemia). ?A problem with a small gland located in the lower front part of the neck (thyroid disorder). ?Rheumatologic conditions. These are problems related to the body's defense system (immune system). ?Infections, especially certain viral infections. ?Fatigue can also lead to negative health outcomes over time. ?Follow these instructions at home: ?Medicines ?Take over-the-counter and prescription medicines only as told by your health care provider. ?Take a multivitamin if told by your health care provider. ?Do not use herbal or dietary supplements unless they are approved by your health care provider. ?Eating and drinking ? ?Avoid heavy meals in the evening. ?Eat a well-balanced diet, which includes lean proteins, whole grains, plenty of fruits and vegetables, and low-fat dairy products. ?Avoid eating or drinking too many products with caffeine in them. ?Avoid alcohol. ?Drink enough fluid to keep your urine pale yellow. ?Activity ? ?Exercise regularly, as told by your health care provider. ?Use or practice techniques to help you relax, such as yoga, tai chi, meditation, or massage therapy. ?Lifestyle ?Change situations that cause you stress. Try to keep your work and personal schedules in balance. ?Do not use recreational or illegal drugs. ?General instructions ?Monitor your fatigue for any changes. ?Go to bed and get up at the same time every day. ?Avoid fatigue by pacing yourself during the day and getting enough sleep at night. ?Maintain a healthy weight. ?Contact a health care  provider if: ?Your fatigue does not get better. ?You have a fever. ?You suddenly lose or gain weight. ?You have headaches. ?You have trouble falling asleep or sleeping through the night. ?You feel angry, guilty, anxious, or sad. ?You have swelling in your legs or another part of your body. ?Get help right away if: ?You feel confused, feel like you might faint, or faint. ?Your vision is blurry or you have a severe headache. ?You have severe pain in your abdomen, your back, or the area between your waist and hips (pelvis). ?You have chest pain, shortness of breath, or an irregular or fast heartbeat. ?You are unable to urinate, or you urinate less than normal. ?You have abnormal bleeding from the rectum, nose, lungs, nipples, or, if you are female, the vagina. ?You vomit blood. ?You have thoughts about hurting yourself or others. ?These symptoms may be an emergency. Get help right away. Call 911. ?Do not wait to see if the symptoms will go away. ?Do not drive yourself to the hospital. ?Get help right away if you feel like you may hurt yourself or others, or have thoughts about taking your own life. Go to your nearest emergency room or: ?Call 911. ?Call the National Suicide Prevention Lifeline at 1-800-273-8255 or 988. This is open 24 hours a day. ?Text the Crisis Text Line at 741741. ?Summary ?If you have fatigue, you feel tired all the time and have a lack of energy or a lack of motivation. ?Fatigue may make it difficult to start or complete tasks because of exhaustion. ?Long-term (chronic) or extreme fatigue may be a symptom of a medical condition. ?Exercise regularly, as told by your health care provider. ?  Change situations that cause you stress. Try to keep your work and personal schedules in balance. ?This information is not intended to replace advice given to you by your health care provider. Make sure you discuss any questions you have with your health care provider. ?Document Revised: 11/27/2020 Document  Reviewed: 11/27/2020 ?Elsevier Patient Education ? [place]. ?Quality Sleep Information, Adult ?Quality sleep is important for your mental and physical health. It also improves your quality of life. Quality sleep means you: ?Are asleep for most of the time you are in bed. ?Fall asleep within 30 minutes. ?Wake up no more than once a night.  ?Are awake for no longer than 20 minutes if you do wake up during the night. ?Most adults need 7-8 hours of quality sleep each night. ?How can poor sleep affect me? ?If you do not get enough quality sleep, you may have: ?Mood swings. ?Daytime sleepiness. ?Confusion. ?Decreased reaction time. ?Sleep disorders, such as insomnia and sleep apnea. ?Difficulty with: ?Solving problems. ?Coping with stress. ?Paying attention. ?These issues may affect your performance and productivity at work, school, and at home. Lack of sleep may also put you at higher risk for accidents, suicide, and risky behaviors. ?If you do not get quality sleep you may also be at higher risk for several health problems, including: ?Infections. ?Type 2 diabetes. ?Heart disease. ?High blood pressure. ?Obesity. ?Worsening of long-term conditions, like arthritis, kidney disease, depression, Parkinson's disease, and epilepsy. ?What actions can I take to get more quality sleep? ? ?  ? ?Stick to a sleep schedule. Go to sleep and wake up at about the same time each day. Do not try to sleep less on weekdays and make up for lost sleep on weekends. This does not work. ?Try to get about 30 minutes of exercise on most days. Do not exercise 2-3 hours before going to bed. ?Limit naps during the day to 30 minutes or less. ?Do not use any products that contain nicotine or tobacco, such as cigarettes or e-cigarettes. If you need help quitting, ask your health care provider. ?Do not drink caffeinated beverages for at least 8 hours before going to bed. Coffee, tea, and some sodas contain caffeine. ?Do not drink alcohol close  to bedtime. ?Do not eat large meals close to bedtime. ?Do not take naps in the late afternoon. ?Try to get at least 30 minutes of sunlight every day. Morning sunlight is best. ?Make time to relax before bed. Reading, listening to music, or taking a hot bath promotes quality sleep. ?Make your bedroom a place that promotes quality sleep. Keep your bedroom dark, quiet, and at a comfortable room temperature. Make sure your bed is comfortable. Take out sleep distractions like TV, a computer, smartphone, and bright lights. ?If you are lying awake in bed for longer than 20 minutes, get up and do a relaxing activity until you feel sleepy. ?Work with your health care provider to treat medical conditions that may affect sleeping, such as: ?Nasal obstruction. ?Snoring. ?Sleep apnea and other sleep disorders. ?Talk to your health care provider if you think any of your prescription medicines may cause you to have difficulty falling or staying asleep. ?If you have sleep problems, talk with a sleep consultant. If you think you have a sleep disorder, talk with your health care provider about getting evaluated by a specialist. ?Where to find more information ?[place] website: https://sleepfoundation.org ?National Heart, Lung, and [place] ([place]): http://www.saunders.info/.pdf ?Centers for Disease Control and Prevention (CDC): LearningDermatology.pl ?  Contact a health care provider if you: ?Have trouble getting to sleep or staying asleep. ?Often wake up very early in the morning and cannot get back to sleep. ?Have daytime sleepiness. ?Have daytime sleep attacks of suddenly falling asleep and sudden muscle weakness (narcolepsy). ?Have a tingling sensation in your legs with a strong urge to move your legs (restless legs syndrome). ?Stop breathing briefly during sleep (sleep apnea). ?Think you have a sleep disorder or are taking a medicine that is affecting your quality of  sleep. ?Summary ?Most adults need 7-8 hours of quality sleep each night. ?Getting enough quality sleep is an important part of health and well-being. ?Make your bedroom a place that promotes quality sleep and avo

## 2021-06-18 LAB — COMPREHENSIVE METABOLIC PANEL
ALT: 12 IU/L (ref 0–32)
AST: 20 IU/L (ref 0–40)
Albumin/Globulin Ratio: 1.9 (ref 1.2–2.2)
Albumin: 4.4 g/dL (ref 3.8–4.8)
Alkaline Phosphatase: 68 IU/L (ref 44–121)
BUN/Creatinine Ratio: 16 (ref 12–28)
BUN: 13 mg/dL (ref 8–27)
Bilirubin Total: 0.3 mg/dL (ref 0.0–1.2)
CO2: 24 mmol/L (ref 20–29)
Calcium: 9.5 mg/dL (ref 8.7–10.3)
Chloride: 107 mmol/L — ABNORMAL HIGH (ref 96–106)
Creatinine, Ser: 0.8 mg/dL (ref 0.57–1.00)
Globulin, Total: 2.3 g/dL (ref 1.5–4.5)
Glucose: 87 mg/dL (ref 70–99)
Potassium: 4.3 mmol/L (ref 3.5–5.2)
Sodium: 145 mmol/L — ABNORMAL HIGH (ref 134–144)
Total Protein: 6.7 g/dL (ref 6.0–8.5)
eGFR: 82 mL/min/{1.73_m2} (ref >59)

## 2021-06-18 LAB — ANA COMPREHENSIVE PANEL
Anti JO-1: <0.2 AI (ref 0.0–0.9)
Centromere Ab Screen: <0.2 AI (ref 0.0–0.9)
Chromatin Ab SerPl-aCnc: <0.2 AI (ref 0.0–0.9)
ENA RNP Ab: <0.2 AI (ref 0.0–0.9)
ENA SM Ab Ser-aCnc: <0.2 AI (ref 0.0–0.9)
ENA SSA (RO) Ab: <0.2 AI (ref 0.0–0.9)
ENA SSB (LA) Ab: <0.2 AI (ref 0.0–0.9)
Scleroderma (Scl-70) (ENA) Antibody, IgG: <0.2 AI (ref 0.0–0.9)
dsDNA Ab: 1 IU/mL (ref 0–9)

## 2021-06-18 LAB — PROTEIN ELECTROPHORESIS, SERUM
A/G Ratio: 1.4 (ref 0.7–1.7)
Albumin ELP: 3.9 g/dL (ref 2.9–4.4)
Alpha 1: 0.2 g/dL (ref 0.0–0.4)
Alpha 2: 0.6 g/dL (ref 0.4–1.0)
Beta: 0.9 g/dL (ref 0.7–1.3)
Gamma Globulin: 1 g/dL (ref 0.4–1.8)
Globulin, Total: 2.8 g/dL (ref 2.2–3.9)

## 2021-06-19 ENCOUNTER — Telehealth: Payer: Self-pay

## 2021-06-19 NOTE — Telephone Encounter (Signed)
LVM for pt to call me back to schedule sleep study  

## 2021-06-20 ENCOUNTER — Telehealth: Payer: Self-pay | Admitting: Neurology

## 2021-06-20 NOTE — Telephone Encounter (Signed)
Called the patient and reviewed the lab results with her. Pt verbalized understanding. Pt had no questions at this time but was encouraged to call back if questions arise.  

## 2021-06-20 NOTE — Telephone Encounter (Signed)
-----   Message from Larey Seat, MD sent at 06/18/2021  5:06 PM EDT ----- ?Hi Aubri, all metabolic panel data look good, the sodium may be a one time off.  ?Negative autoimmune/ rheumatology panel is also encouraging. Normal protein distribution, no bone marrow abnormality seen.  ?All these are good news! ?

## 2021-07-05 ENCOUNTER — Ambulatory Visit (INDEPENDENT_AMBULATORY_CARE_PROVIDER_SITE_OTHER): Payer: Medicare Other | Admitting: Neurology

## 2021-07-05 DIAGNOSIS — G4719 Other hypersomnia: Secondary | ICD-10-CM

## 2021-07-05 DIAGNOSIS — E210 Primary hyperparathyroidism: Secondary | ICD-10-CM | POA: Diagnosis not present

## 2021-07-05 DIAGNOSIS — G4734 Idiopathic sleep related nonobstructive alveolar hypoventilation: Secondary | ICD-10-CM

## 2021-07-05 DIAGNOSIS — G4733 Obstructive sleep apnea (adult) (pediatric): Secondary | ICD-10-CM

## 2021-07-05 DIAGNOSIS — F5104 Psychophysiologic insomnia: Secondary | ICD-10-CM

## 2021-07-05 DIAGNOSIS — R5382 Chronic fatigue, unspecified: Secondary | ICD-10-CM

## 2021-07-05 DIAGNOSIS — R519 Headache, unspecified: Secondary | ICD-10-CM

## 2021-07-07 DIAGNOSIS — R5382 Chronic fatigue, unspecified: Secondary | ICD-10-CM | POA: Insufficient documentation

## 2021-07-07 DIAGNOSIS — G4719 Other hypersomnia: Secondary | ICD-10-CM | POA: Insufficient documentation

## 2021-07-07 DIAGNOSIS — R519 Headache, unspecified: Secondary | ICD-10-CM | POA: Insufficient documentation

## 2021-07-07 NOTE — Addendum Note (Signed)
Addended by: Larey Seat on: 07/07/2021 05:53 PM   Modules accepted: Orders

## 2021-07-07 NOTE — Procedures (Signed)
PATIENT'S NAME:  Tina [name], Word DOB:      Nov 16, 1955      MR#:    [id number]     DATE OF RECORDING: 07/05/2021 REFERRING M.D.:  Catalina Antigua, MD Study Performed:   Baseline Polysomnogram HISTORY:   Tina [name] is a 66 y.o. African American female patient and former employee of [employer]. She was seen in my Sleep Clinic on 06/14/2021 .  Pt referred for snoring, AM headaches, she was diagnosed with OSA but could not tolerate CPAP (over 69yr ago-2010 menopausal hot flushes still present, vivid dreams). Her past medical history of GERD (gastroesophageal reflux disease), Hypertension, and Obesity. Hyper-parathyroidism and - PTH ectomy 11-2020- and Hypercalcemia and uremia. 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  Morning headaches could be related to hypoxia.   The patient endorsed the Epworth Sleepiness Scale at 19/24 points.  FSS at 57/63 points.  The patient's weight 231 pounds with a height of 62 (inches), resulting in a BMI of 42.6 kg/m2. The patient's neck circumference measured 15.5 inches.  CURRENT MEDICATIONS: Aspirin, Black Cohosh, Nexium Naprosyn   PROCEDURE:  This is a multichannel digital polysomnogram utilizing the Somnostar 11.2 system.  Electrodes and sensors were applied and monitored per AASM Specifications.   EEG, EOG, Chin and Limb EMG, were sampled at 200 Hz.  ECG, Snore and Nasal Pressure, Thermal Airflow, Respiratory Effort, CPAP Flow and Pressure, Oximetry was sampled at 50 Hz. Digital video and audio were recorded.      BASELINE STUDY: Lights Out was at 22:02 and Lights On at 05:04.  Total recording time (TRT) was 423 minutes, with a total sleep time (TST) of 358.5 minutes.   The patient's sleep latency was 45 minutes.  REM latency was 174.5 minutes.  The sleep efficiency was 84.8 %.     SLEEP ARCHITECTURE: WASO (Wake after sleep onset) was 35.5 minutes.  There were 8.5 minutes in Stage N1, 282.5 minutes Stage N2, 32.5 minutes Stage N3 and 35 minutes in Stage REM.   The percentage of Stage N1 was 2.4%, Stage N2 was 78.8%, Stage N3 was 9.1% and Stage R (REM sleep) was 9.8%.   RESPIRATORY ANALYSIS:  There were a total of 114 respiratory events:  62 obstructive apneas, 0 central apneas and 2 mixed apneas with a total of 64 apneas and an apnea index (AI) of 10.7 /hour. There were 50 hypopneas with a hypopnea index of 8.4 /hour.      The total APNEA/HYPOPNEA INDEX (AHI) was 19.1/hour.  23 events occurred in REM sleep and 87 events in NREM.  The REM AHI was  39.4 /hour, versus a non-REM AHI of 16.9.  The patient spent 109.5 minutes of total sleep time in the supine position and 249 minutes in non-supine. The supine AHI was 33.5/h versus a non-supine AHI of 12.7/h.  OXYGEN SATURATION & C02:  The Wake baseline 02 saturation was 97%, with the lowest 02 saturation being 71%. Time spent below 89% saturation equaled 52 minutes.  The arousals were noted as: 127 were spontaneous, 0 were associated with PLMs, 40 were associated with respiratory events. The patient had a total of 0 Periodic Limb Movements.   Audio and video analysis did not show any abnormal or unusual movements, behaviors, phonations, or vocalizations.   Snoring was Mild.  EKG with variable QRS Complex formations, but regular R to R intervals.    IMPRESSION:   Moderate Obstructive Sleep Apnea(OSA) became severe sleep apnea during REM sleep and was associated  with REM dependent oxygen desaturation. The duration of hypoxia at 52 minutes and 02 saturation nadir at 71% are severe.  This is a typical manifestation of sleep hypopnea/ hypoventilation in REM sleep and supine sleep. the patient also had coughing spells interfering with her sleep.  Non-specific abnormal EKG    RECOMMENDATIONS:  Advise full-night, attended, CPAP titration study to optimize therapy.  This apnea type is not responsive to alternative therapies, such as dental devices or Inspire- it needs Positive Airway pressure to allow deeper  breathing and that cannot be provided by therapies that just keep the upper airway open. The patient has retrognathia and did not tolerate FFM in the past.  Hypoventilation is responsive to weight loss and avoiding the supine sleep position will also help. If insurance will not permit an in- lab titration, please order auto CPAP with ResMed device, heated humidification, interface of patients choosing and comfort- settings between 6 and 16 cm water, 2 cm EPR and RV in 60-90 days of therapy.   I certify that I have reviewed the entire raw data recording prior to the issuance of this report in accordance with the Standards of Accreditation of the American Academy of Sleep Medicine (AASM)    Larey Seat, MD Diplomat, American Board of Neurology  Diplomat, American Board of Sleep Medicine Market researcher, [employer] [facility] [employer]

## 2021-07-09 ENCOUNTER — Telehealth: Payer: Self-pay

## 2021-07-09 NOTE — Telephone Encounter (Signed)
I called pt. I advised pt that Dr. Brett Fairy reviewed their sleep study results and found that patient has moderate to severe osa and recommends that pt be treated with a cpap. Dr. Brett Fairy recommends that pt return for a repeat sleep study in order to properly titrate the cpap and ensure a good mask fit. Pt is agreeable to returning for a titration study. I advised pt that our sleep lab will file with pt's insurance and call pt to schedule the sleep study when we hear back from the pt's insurance regarding coverage of this sleep study. Pt verbalized understanding of results. Pt had no questions at this time but was encouraged to call back if questions arise.

## 2021-07-09 NOTE — Telephone Encounter (Signed)
-----   Message from Larey Seat, MD sent at 07/07/2021  5:53 PM EDT ----- IMPRESSION:  1.  Moderate Obstructive Sleep Apnea(OSA) became severe sleep apnea during REM sleep and was associated with REM dependent oxygen desaturation. The duration of hypoxia at 52 minutes and 02 saturation nadir at 71% are severe.  2. This is a typical manifestation of sleep hypopnea/ hypoventilation in REM sleep and supine sleep. the patient also had coughing spells interfering with her sleep.  3. Non-specific abnormal EKG    RECOMMENDATIONS:  1. Advise full-night, attended, CPAP titration study to optimize therapy.  This apnea type is not responsive to alternative therapies, such as dental devices or Inspire- it needs Positive Airway pressure to allow deeper breathing and that cannot be provided by therapies that just keep the upper airway open. The patient has retrognathia and did not tolerate FFM in the past.  2. Hypoventilation is responsive to weight loss and avoiding the supine sleep position will also help. 3. If insurance will not permit an in- lab titration, please order auto CPAP with ResMed device, heated humidification, interface of patients choosing and comfort- settings between 6 and 16 cm water, 2 cm EPR and RV in 60-90 days of therapy.

## 2021-07-10 ENCOUNTER — Telehealth: Payer: Self-pay

## 2021-07-10 NOTE — Telephone Encounter (Signed)
LVM for pt to call me back to schedule sleep study  

## 2021-07-11 NOTE — Telephone Encounter (Signed)
Patient returned the call, she is scheduled at [facility] for 07/22/21 at 8pm

## 2021-07-22 ENCOUNTER — Ambulatory Visit (INDEPENDENT_AMBULATORY_CARE_PROVIDER_SITE_OTHER): Payer: Medicare Other | Admitting: Neurology

## 2021-07-22 DIAGNOSIS — G4734 Idiopathic sleep related nonobstructive alveolar hypoventilation: Secondary | ICD-10-CM | POA: Diagnosis not present

## 2021-07-22 DIAGNOSIS — G4733 Obstructive sleep apnea (adult) (pediatric): Secondary | ICD-10-CM

## 2021-07-22 DIAGNOSIS — G4719 Other hypersomnia: Secondary | ICD-10-CM

## 2021-07-22 DIAGNOSIS — R5382 Chronic fatigue, unspecified: Secondary | ICD-10-CM

## 2021-07-22 DIAGNOSIS — E210 Primary hyperparathyroidism: Secondary | ICD-10-CM

## 2021-07-22 DIAGNOSIS — R519 Headache, unspecified: Secondary | ICD-10-CM

## 2021-07-31 ENCOUNTER — Encounter: Payer: Self-pay | Admitting: Neurology

## 2021-08-05 NOTE — Procedures (Signed)
PATIENT'S NAME:  Tina [name], Tina [name] DOB:      Sep 01, 1955      MR#:    [id number]     DATE OF RECORDING: 07/22/2021 [place]  REFERRING M.D.:  Catalina Antigua, MD Study Performed:   Titration to positive airway pressure.  HISTORY:  Britt Theard returns for a CPAP titration study following her 07-05-2021 in lab baseline PSG which resulted in a dx of moderate sleep apnea at AHI of 19.1/h , supine dependent  and strongly REM sleep dependent apnea, REM AHI was 39.4/h. The total hypoxia time was 52 minutes.  Nadir of 02 saturation was 71%.   The patient endorsed the Epworth Sleepiness Scale at 19 points.   The patient's weight 231 pounds with a height of 62 (inches), resulting in a BMI of 42.6 kg/m2. The patient's neck circumference measured 15.5 inches.  CURRENT MEDICATIONS: Aspirin, Black Cohosh, Nexium, Naprosyn    PROCEDURE:  This is a multichannel digital polysomnogram utilizing the SomnoStar 11.2 system.  Electrodes and sensors were applied and monitored per AASM Specifications.   EEG, EOG, Chin and Limb EMG, were sampled at 200 Hz.  ECG, Snore and Nasal Pressure, Thermal Airflow, Respiratory Effort, CPAP Flow and Pressure, Oximetry was sampled at 50 Hz. Digital video and audio were recorded.      CPAP was initiated with a medium sized EVORA nasal mask at 5 cmH20 with heated humidity per AASM night standards and pressure was advanced to 6 cmH20 because of hypopneas, apneas and desaturations.  At a PAP pressure of 6 cmH20, there was a reduction of the AHI to 0.2 with improvement of sleep apnea.  Lights Out was at 21:47 and Lights On at 05:08. Total recording time (TRT) was 441.5 minutes, with a total sleep time (TST) of 380.5 minutes. The patient's sleep latency was 39 minutes. REM latency was 145.5 minutes.  The sleep efficiency was 86.2 %.    SLEEP ARCHITECTURE: WASO (Wake after sleep onset) was 31.5 minutes.  There were 3 minutes in Stage N1, 259 minutes Stage N2, 52 minutes Stage N3 and 66.5 minutes  in Stage REM.  The percentage of Stage N1 was .8%, Stage N2 was 68.1%, Stage N3 was 13.7% and Stage R (REM sleep) was 17.5%.    RESPIRATORY ANALYSIS:  There was a total of 5 respiratory events: 5 hypopneas with a hypopnea index of .8/hour. The patient also had 0 respiratory event related arousals (RERAs).      The total APNEA/HYPOPNEA INDEX (AHI) was 0.8 /hour.  1 event occurred in REM sleep and 4 events in NREM. The REM AHI was 0.9 /hour versus a non-REM AHI of 0.8 /hour.  The patient spent 0 minutes of total sleep time in the supine position(!) and all 381 minutes of sleep time in non-supine.   OXYGEN SATURATION & C02:  The baseline 02 saturation was 95%, with the lowest being 91%. Time spent below 89% saturation equaled 0 minutes.  The patient had a total of 0 Periodic Limb Movements.  The arousals were noted as: 57 were spontaneous, 0 were associated with PLMs, 4 were associated with respiratory events. Audio and video analysis did not show any abnormal or unusual movements, behaviors, phonations or vocalizations. Snoring was not noted. EKG was in keeping with normal sinus rhythm (NSR).   DIAGNOSIS Obstructive Sleep Apnea with hypoxia and strong REM sleep dependence was completely alleviated at a pressure of only 6 cm water, under use of an EVORA nasal mask.  normal EKG  PLANS/RECOMMENDATIONS:  CPAP therapy compliance is defined as 4 hours or more of nightly use. This patient will use an autotitration capable device CPAP set between 5 and 8 cm water, 1 cm EPR, heated humidification and the Evora nasal mask in medium size.   DISCUSSION: Compliance Rv within 60-90 days of CPAP use. A follow up appointment will be scheduled in the Sleep Clinic at [facility].   Please call [phone] with any questions.      I certify that I have reviewed the entire raw data recording prior to the issuance of this report in accordance with the Standards of Accreditation of the American  Academy of Sleep Medicine (AASM)      '[]'$ Larey Seat, M.D. Diplomat, Tax adviser of Psychiatry and Neurology  Diplomat, Tax adviser of Sleep Medicine Market researcher, [employer] [facility] [employer]

## 2021-08-05 NOTE — Addendum Note (Signed)
Addended by: Larey Seat on: 08/05/2021 03:36 PM   Modules accepted: Orders

## 2021-08-07 ENCOUNTER — Encounter: Payer: Self-pay | Admitting: *Deleted

## 2021-08-07 ENCOUNTER — Telehealth: Payer: Self-pay | Admitting: *Deleted

## 2021-08-07 NOTE — Telephone Encounter (Signed)
-----   Message from Larey Seat, MD sent at 08/05/2021  3:36 PM EDT ----- DIAGNOSIS 1. Obstructive Sleep Apnea with hypoxia and strong REM sleep dependence was completely alleviated at a pressure of only 6 cm water, under use of an EVORA nasal mask.  2. normal EKG  PLANS/RECOMMENDATIONS: CPAP therapy compliance is defined as 4 hours or more of nightly use. This patient will use an autotitration capable device CPAP set between 5 and 8 cm water, 1 cm EPR, heated humidification and the Evora nasal mask in medium size.   DISCUSSION: Compliance Rv within 60-90 days of CPAP use. A follow up appointment will be scheduled in the Sleep Clinic at [facility].   Please call [phone] with any questions.

## 2021-08-07 NOTE — Telephone Encounter (Signed)
Took call from phone staff and spoke with pt. Relayed results below. I reviewed PAP compliance expectations with the pt. Pt is agreeable to starting a CPAP. I advised pt that an order will be sent to a DME, [employer] will call the pt within about one week after they file with the pt's insurance. [employer] will show the pt how to use the machine, fit for masks, and troubleshoot the CPAP if needed. A follow up appt was made for insurance purposes with Dr. Brett Fairy on 10/30/21 at 2:30pm (she requested late afternoon appt and no Thurs appt). Pt verbalized understanding to arrive 15 minutes early and bring their CPAP. A letter with all of this information in it will be mailed to the pt as a reminder. I verified with the pt that the address we have on file is correct. Pt verbalized understanding of results. Pt had no questions at this time but was encouraged to call back if questions arise. I have sent the order to [employer] and have received confirmation that they have received the order.

## 2021-08-07 NOTE — Telephone Encounter (Signed)
LVM for pt to call about results. Aware I will also send a mychart message to her. Asked her to either call back or send mychart message back.

## 2021-09-03 ENCOUNTER — Other Ambulatory Visit: Payer: Self-pay | Admitting: Family Medicine

## 2021-09-03 DIAGNOSIS — Z1231 Encounter for screening mammogram for malignant neoplasm of breast: Secondary | ICD-10-CM

## 2021-09-06 ENCOUNTER — Ambulatory Visit
Admission: RE | Admit: 2021-09-06 | Discharge: 2021-09-06 | Disposition: A | Discharge status: Home / Self Care | Payer: Medicare Other | Source: Ambulatory Visit | Attending: Family Medicine | Admitting: Family Medicine

## 2021-09-06 DIAGNOSIS — Z1231 Encounter for screening mammogram for malignant neoplasm of breast: Secondary | ICD-10-CM

## 2021-09-25 DIAGNOSIS — N959 Unspecified menopausal and perimenopausal disorder: Secondary | ICD-10-CM | POA: Insufficient documentation

## 2021-10-30 ENCOUNTER — Encounter: Payer: Self-pay | Admitting: Neurology

## 2021-10-30 ENCOUNTER — Ambulatory Visit: Payer: Medicare Other | Admitting: Neurology

## 2021-10-30 VITALS — BP 166/93 | HR 78 | Ht 61.0 in | Wt 227.0 lb

## 2021-10-30 DIAGNOSIS — G4733 Obstructive sleep apnea (adult) (pediatric): Secondary | ICD-10-CM | POA: Diagnosis not present

## 2021-10-30 DIAGNOSIS — Z9989 Dependence on other enabling machines and devices: Secondary | ICD-10-CM | POA: Diagnosis not present

## 2021-10-30 DIAGNOSIS — G4734 Idiopathic sleep related nonobstructive alveolar hypoventilation: Secondary | ICD-10-CM | POA: Diagnosis not present

## 2021-10-30 DIAGNOSIS — Z6839 Body mass index (BMI) 39.0-39.9, adult: Secondary | ICD-10-CM

## 2021-10-30 NOTE — Patient Instructions (Signed)
Living With Sleep Apnea Sleep apnea is a condition in which breathing pauses or becomes shallow during sleep. Sleep apnea is most commonly caused by a collapsed or blocked airway. People with sleep apnea usually snore loudly. They may have times when they gasp and stop breathing for 10 seconds or more during sleep. This may happen many times during the night. The breaks in breathing also interrupt the deep sleep that you need to feel rested. Even if you do not completely wake up from the gaps in breathing, your sleep may not be restful and you feel tired during the day. You may also have a headache in the morning and low energy during the day, and you may feel anxious or depressed. How can sleep apnea affect me? Sleep apnea increases your chances of extreme tiredness during the day (daytime fatigue). It can also increase your risk for health conditions, such as: Heart attack. Stroke. Obesity. Type 2 diabetes. Heart failure. Irregular heartbeat. High blood pressure. If you have daytime fatigue as a result of sleep apnea, you may be more likely to: Perform poorly at school or work. Fall asleep while driving. Have difficulty with attention. Develop depression or anxiety. Have sexual dysfunction. What actions can I take to manage sleep apnea? Sleep apnea treatment  If you were given a device to open your airway while you sleep, use it only as told by your health care provider. You may be given: An oral appliance. This is a custom-made mouthpiece that shifts your lower jaw forward. A continuous positive airway pressure (CPAP) device. This device blows air through a mask when you breathe out (exhale). A nasal expiratory positive airway pressure (EPAP) device. This device has valves that you put into each nostril. A bi-level positive airway pressure (BIPAP) device. This device blows air through a mask when you breathe in (inhale) and breathe out (exhale). You may need surgery if other treatments  do not work for you. Sleep habits Go to sleep and wake up at the same time every day. This helps set your internal clock (circadian rhythm) for sleeping. If you stay up later than usual, such as on weekends, try to get up in the morning within 2 hours of your normal wake time. Try to get at least 7-9 hours of sleep each night. Stop using a computer, tablet, and mobile phone a few hours before bedtime. Do not take long naps during the day. If you nap, limit it to 30 minutes. Have a relaxing bedtime routine. Reading or listening to music may relax you and help you sleep. Use your bedroom only for sleep. Keep your television and computer out of your bedroom. Keep your bedroom cool, dark, and quiet. Use a supportive mattress and pillows. Follow your health care provider's instructions for other changes to sleep habits. Nutrition Do not eat heavy meals in the evening. Do not have caffeine in the later part of the day. The effects of caffeine can last for more than 5 hours. Follow your health care provider's or dietitian's instructions for any diet changes. Lifestyle     Do not drink alcohol before bedtime. Alcohol can cause you to fall asleep at first, but then it can cause you to wake up in the middle of the night and have trouble getting back to sleep. Do not use any products that contain nicotine or tobacco. These products include cigarettes, chewing tobacco, and vaping devices, such as e-cigarettes. If you need help quitting, ask your health care provider. Medicines Take   over-the-counter and prescription medicines only as told by your health care provider. Do not use over-the-counter sleep medicine. You can become dependent on this medicine, and it can make sleep apnea worse. Do not use medicines, such as sedatives and narcotics, unless told by your health care provider. Activity Exercise on most days, but avoid exercising in the evening. Exercising near bedtime can interfere with  sleeping. If possible, spend time outside every day. Natural light helps regulate your circadian rhythm. General information Lose weight if you need to, and maintain a healthy weight. Keep all follow-up visits. This is important. If you are having surgery, make sure to tell your health care provider that you have sleep apnea. You may need to bring your device with you. Where to find more information Learn more about sleep apnea and daytime fatigue from: American Sleep Association: sleepassociation.org National Sleep Foundation: sleepfoundation.org National Heart, Lung, and Blood Institute: nhlbi.nih.gov Summary Sleep apnea is a condition in which breathing pauses or becomes shallow during sleep. Sleep apnea can cause daytime fatigue and other serious health conditions. You may need to wear a device while sleeping to help keep your airway open. If you are having surgery, make sure to tell your health care provider that you have sleep apnea. You may need to bring your device with you. Making changes to sleep habits, diet, lifestyle, and activity can help you manage sleep apnea. This information is not intended to replace advice given to you by your health care provider. Make sure you discuss any questions you have with your health care provider. Document Revised: 09/13/2020 Document Reviewed: 01/14/2020 Elsevier Patient Education  2023 Elsevier Inc.  

## 2021-10-30 NOTE — Progress Notes (Signed)
SLEEP MEDICINE CLINIC    Provider:  Larey Seat, MD  Primary Care Physician:  Leeanne Rio, MD [place] [address]     Referring Provider: Leeanne Rio, Md [place],  [place] [address]          Chief Complaint according to patient   Patient presents with:     New Patient (Initial Visit)           HISTORY OF PRESENT ILLNESS:  Tina [name] is a 66 y.o. African American female patient seen here on 10/30/2021 .  This is a RV after sleep study-  This base baseline polysomnography was performed on 07-05-2021. Study showed a baseline AHI of 19.1/h, R supine AHI was 33. 5/h and REM AHI was 39.4/h.  52 minutes of low oxygen saturation .   The patient presents with 100% compliance , 5 through 8 cm water with a residual AHI of 0.1/h , minimal air leak on nasal cradle and 1 cm EPR.  95% pressure was 7.9 cm - may need to lift.  Auto humidity settings, and good resolution of symptoms. Some times dry mouth.    CPAP was prescribed;            Chief concern according to patient :  Pt referred for snoring, AM headaches.  Hx of OSA but could not tolerate cpap (over 98yr ago). Last SS over 12 years ago. 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 Hyperparathyroidism,  and ectomy 11-2020. Hypercalcemia and uremia.   OSA in 2010, CPAP intolerant.  ROS : Dizziness 9 now resolved ) . Menopausal hot flushes. Hysterectomy.  Sleep relevant medical history: Nocturia 2-3 times , Obesity, snoring. Hypersomnia, EDS-  no sleep walking , no concussion, TBI.    Family medical /sleep history: NO other family member on CPAP with OSA, insomnia, sleep walkers.    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  The  patient used to work in shifts( nPresenter, broadcasting) Pets are not present. Tobacco use- former smoker and vapor user.   ETOH use ; rare , Caffeine intake in form of Coffee( 3 a week) Soda( /) Tea ( when eating out ). Regular exercise in form of walking.   Hobbies :cooking, baking       Sleep habits are as follows: The patient's dinner time is between 5-6 PM. The patient goes to bed at 10-11 PM and continues to struggle to sleep for 0.5-2 hours,  If  she fall asleep, she may wake up 20 -3 times wakes for bathroom breaks, from GERD, by sternal pain, knee pain.  The preferred sleep position is prone , with the support of 1-2 pillows.  Dreams are reportedly frequent/vivid.   5.30 AM is the usual rise time. The patient wakes up spontaneously at 5 AM .  She reports mostly not feeling refreshed or restored in AM, with symptoms such as dry mouth, morning headaches, and residual fatigue. Naps are taken frequently, lasting from 12- to 2 PM-  1- 2 hours and feeling refreshed ..Marland Kitchen   Review of Systems: Out of a complete 14 system review, the patient complains of only the following symptoms, and all  other reviewed systems are negative.:  Fatigue, sleepiness , snoring, fragmented sleep, Insomnia   Still snoring? GERD. Nocturia.   SOB, palpitations,   swallowing problems Tinnitus   Headaches  anytime. Pain is shooting- stabbing, short and intense.  Usually not triggered.     How likely are you to doze in the following situations: 0 = not likely, 1 = slight chance, 2 = moderate chance, 3 = high chance   Sitting and Reading? Watching Television? Sitting inactive in a public place (theater or meeting)? As a passenger in a car for an hour without a break? Lying down in the afternoon when circumstances permit? Sitting and talking to someone? Sitting quietly after lunch without alcohol? In a car, while stopped for a few minutes in traffic?   Total = pre CPAP at 19/ 24 points , now 4/ 24 points.    FSS endorsed at 57/ 63 points, 23/ 63 points.   GDS 8 / 15 clinical depression index.   No more HA, nocturia and not snoring,   Social History   Socioeconomic History   Marital status: Divorced    Spouse name: Not on file   Number of children: 2   Years of education: Not on file   Highest education level: Associate degree: academic program  Occupational History   Not on file  Tobacco Use   Smoking status: Former    Types: Cigarettes    Quit date: 10/17/1985    Years since quitting: 36.0   Smokeless tobacco: Never  Vaping Use   Vaping Use: Never used  Substance and Sexual Activity   Alcohol use: Yes    Comment: wine 4 times a year maybe   Drug use: Never   Sexual activity: Not on file  Other Topics Concern   Not on file  Social History Narrative   Lives alone   L handed   Caffeine: 1 C of Coffee a day occas   Social Determinants of Health   Financial Resource Strain: Not on file  Food Insecurity: Not on file  Transportation Needs: Not on file  Physical Activity: Not on file  Stress: Not on file  Social Connections: Not on file    Family History  Problem Relation Age of Onset   Stomach cancer Mother    Kidney failure Father    Breast cancer Neg Hx     Past Medical History:  Diagnosis Date   GERD (gastroesophageal reflux disease)    Hypertension    Obesity  Parathyroidism   OSA- CPAP not tolerated      Past Surgical History:  Procedure Laterality Date   ABDOMINAL HYSTERECTOMY     BREAST SURGERY     biopsy     Current Outpatient Medications on File Prior to Visit  Medication Sig Dispense Refill   aspirin 81 MG tablet Take 81 mg by mouth daily.     BLACK COHOSH PO Take by mouth.     esomeprazole (NEXIUM) 40 MG capsule Take 40 mg by mouth daily at 12 noon.     Gabapentin 25 MG TABS Take 10 mg by mouth 2 (two) times daily.     Meloxicam 10 MG CAPS Take 10 mg by mouth 1 day or 1 dose.     naproxen (NAPROSYN) 250 MG tablet Take by mouth as needed.       No current facility-administered medications on file prior to visit.    Physical exam:  Today's Vitals   10/30/21 1438  BP: (!) 166/93  Pulse: 78  Weight: 227 lb (103 kg)  Height: '5\' 1"'$  (1.549 m)   Body mass index is 42.89 kg/m.   Wt Readings from Last 3 Encounters:  10/30/21 227 lb (103 kg)  06/14/21 231 lb (104.8 kg)  09/16/16 220 lb 3.2 oz (99.9 kg)     Ht Readings from Last 3 Encounters:  10/30/21 '5\' 1"'$  (1.549 m)  06/14/21 '5\' 2"'$  (1.575 m)  09/16/16 5' 3.25" (1.607 m)      General: The patient is awake, alert and appears not in acute distress. The patient is well groomed. Head: Normocephalic, atraumatic.  Neck is supple. Mallampati 1-2,  neck circumference:15.5  inches . Nasal airflow  patent.  Retrognathia is  seen.  Dental status:  Cardiovascular:  Regular rate and cardiac rhythm by pulse,   without distended neck veins. Respiratory: Lungs are clear to auscultation.  Skin:  With evidence of ankle edema, but no  rash. Trunk: The patient's posture is erect.   Neurologic exam : The patient is awake and alert, oriented to place and time.   Memory subjective described as intact.  Attention span & concentration ability appears normal.  Speech is fluent,  without  dysarthria, dysphonia or aphasia.  Mood and affect are appropriate.   Cranial nerves: no loss of smell or taste reported  Pupils are equal and briskly reactive to light. Funduscopic exam deferred. .  Extraocular movements in vertical and horizontal planes were intact and without nystagmus. No Diplopia. Visual fields by finger perimetry are intact. Hearing was intact to soft voice and finger rubbing.  Tinnitus in both ears - high pitched, non pulsatile   Facial sensation intact to fine touch.  Facial motor strength is symmetric and tongue and uvula move midline.  Neck ROM : rotation, tilt and flexion extension were normal for age and shoulder shrug was symmetrical.    Motor exam:  Symmetric bulk,  tone and ROM.   Normal tone without cog- wheeling, symmetric grip strength .   Sensory:  Fine touch and vibration were tested  and  normal.  Proprioception tested in the upper extremities was normal.   Coordination: Rapid alternating movements in the fingers/hands were of normal speed.  The Finger-to-nose maneuver was intact without evidence of ataxia, dysmetria or tremor.   Gait and station: Patient could rise unassisted from a seated position, walked without assistive device.  Stance is of normal width/ base and the patient turned with 3.5 steps.  Toe and heel walk were deferred.  Deep tendon reflexes: in the  upper and lower extremities are symmetric and intact.  Babinski response was deferred .       After spending a total time of  23 minutes face to face and additional time for physical and neurologic examination, review of laboratory studies,  personal review of imaging studies, reports and results of other testing and review of referral information / records as far as provided in visit, I have established the following assessments:   [place] has a history of insomnia, untreated OSA, snoring and GERD  1) , OSA was confirmed by sleep test and CPAP started on nasal cradle. OSA Risk factors for sleep apnea were retrognathia, BMI and neck size and symptoms of snoring, sleep interruption,  And of  EDS have improved, her headaches have improved and nocturia is gone !  She is not Short of breath.  I will increase CPAP upper pressure by 2 cm water and obtain an ONO on CPAP at home  My Plan is to proceed with:  Continue CPAP use.      I would like to thank Leeanne Rio, MD and Leeanne Rio, Md [address] [place],  [place] [address] for allowing me to meet with and to take care of this pleasant patient.    I plan to follow up either personally or through our NP within 2-4 months.   CC: I will share my notes with Dr Huel Cote.  Electronically signed  by: Larey Seat, MD 10/30/2021 2:46 PM  [facility] Board certified by The [employer] of Sleep Medicine and Diplomate of the [employer] of Sleep Medicine. Board certified In Neurology through the [place], Fellow of the [employer] of Neurology. Medical Director of [facility].

## 2021-11-29 NOTE — Progress Notes (Signed)
Excellent compliance and low residual AHI.

## 2021-12-12 NOTE — Telephone Encounter (Signed)
Visit followed, sleep test followed.

## 2022-02-21 DIAGNOSIS — M170 Bilateral primary osteoarthritis of knee: Secondary | ICD-10-CM | POA: Diagnosis not present

## 2022-02-21 DIAGNOSIS — M779 Enthesopathy, unspecified: Secondary | ICD-10-CM | POA: Diagnosis not present

## 2022-02-21 DIAGNOSIS — M1712 Unilateral primary osteoarthritis, left knee: Secondary | ICD-10-CM | POA: Diagnosis not present

## 2022-02-21 DIAGNOSIS — M1711 Unilateral primary osteoarthritis, right knee: Secondary | ICD-10-CM | POA: Diagnosis not present

## 2022-02-21 DIAGNOSIS — M25511 Pain in right shoulder: Secondary | ICD-10-CM | POA: Diagnosis not present

## 2022-02-25 DIAGNOSIS — M170 Bilateral primary osteoarthritis of knee: Secondary | ICD-10-CM | POA: Diagnosis not present

## 2022-02-27 DIAGNOSIS — M170 Bilateral primary osteoarthritis of knee: Secondary | ICD-10-CM | POA: Diagnosis not present

## 2022-03-07 DIAGNOSIS — M170 Bilateral primary osteoarthritis of knee: Secondary | ICD-10-CM | POA: Diagnosis not present

## 2022-03-11 DIAGNOSIS — M170 Bilateral primary osteoarthritis of knee: Secondary | ICD-10-CM | POA: Diagnosis not present

## 2022-03-14 DIAGNOSIS — M170 Bilateral primary osteoarthritis of knee: Secondary | ICD-10-CM | POA: Diagnosis not present

## 2022-03-16 DIAGNOSIS — G4733 Obstructive sleep apnea (adult) (pediatric): Secondary | ICD-10-CM | POA: Diagnosis not present

## 2022-03-18 DIAGNOSIS — M170 Bilateral primary osteoarthritis of knee: Secondary | ICD-10-CM | POA: Diagnosis not present

## 2022-03-19 DIAGNOSIS — I10 Essential (primary) hypertension: Secondary | ICD-10-CM | POA: Diagnosis not present

## 2022-03-19 DIAGNOSIS — E785 Hyperlipidemia, unspecified: Secondary | ICD-10-CM | POA: Insufficient documentation

## 2022-03-19 DIAGNOSIS — M2550 Pain in unspecified joint: Secondary | ICD-10-CM | POA: Diagnosis not present

## 2022-03-21 DIAGNOSIS — M170 Bilateral primary osteoarthritis of knee: Secondary | ICD-10-CM | POA: Diagnosis not present

## 2022-03-25 DIAGNOSIS — M170 Bilateral primary osteoarthritis of knee: Secondary | ICD-10-CM | POA: Diagnosis not present

## 2022-03-28 DIAGNOSIS — M170 Bilateral primary osteoarthritis of knee: Secondary | ICD-10-CM | POA: Diagnosis not present

## 2022-04-01 DIAGNOSIS — M170 Bilateral primary osteoarthritis of knee: Secondary | ICD-10-CM | POA: Diagnosis not present

## 2022-04-03 DIAGNOSIS — M8589 Other specified disorders of bone density and structure, multiple sites: Secondary | ICD-10-CM | POA: Diagnosis not present

## 2022-04-03 DIAGNOSIS — E785 Hyperlipidemia, unspecified: Secondary | ICD-10-CM | POA: Diagnosis not present

## 2022-04-03 DIAGNOSIS — I10 Essential (primary) hypertension: Secondary | ICD-10-CM | POA: Diagnosis not present

## 2022-04-03 DIAGNOSIS — Z0000 Encounter for general adult medical examination without abnormal findings: Secondary | ICD-10-CM | POA: Diagnosis not present

## 2022-04-03 DIAGNOSIS — R7302 Impaired glucose tolerance (oral): Secondary | ICD-10-CM | POA: Diagnosis not present

## 2022-04-03 DIAGNOSIS — Z87891 Personal history of nicotine dependence: Secondary | ICD-10-CM | POA: Diagnosis not present

## 2022-04-03 DIAGNOSIS — Z1211 Encounter for screening for malignant neoplasm of colon: Secondary | ICD-10-CM | POA: Diagnosis not present

## 2022-04-03 DIAGNOSIS — F32A Depression, unspecified: Secondary | ICD-10-CM | POA: Diagnosis not present

## 2022-04-04 DIAGNOSIS — M170 Bilateral primary osteoarthritis of knee: Secondary | ICD-10-CM | POA: Diagnosis not present

## 2022-04-08 DIAGNOSIS — M170 Bilateral primary osteoarthritis of knee: Secondary | ICD-10-CM | POA: Diagnosis not present

## 2022-04-09 DIAGNOSIS — M79672 Pain in left foot: Secondary | ICD-10-CM | POA: Diagnosis not present

## 2022-04-09 DIAGNOSIS — L03032 Cellulitis of left toe: Secondary | ICD-10-CM | POA: Diagnosis not present

## 2022-04-09 DIAGNOSIS — M25775 Osteophyte, left foot: Secondary | ICD-10-CM | POA: Diagnosis not present

## 2022-04-09 DIAGNOSIS — M79675 Pain in left toe(s): Secondary | ICD-10-CM | POA: Diagnosis not present

## 2022-04-11 DIAGNOSIS — M170 Bilateral primary osteoarthritis of knee: Secondary | ICD-10-CM | POA: Diagnosis not present

## 2022-04-16 DIAGNOSIS — G4733 Obstructive sleep apnea (adult) (pediatric): Secondary | ICD-10-CM | POA: Diagnosis not present

## 2022-04-16 DIAGNOSIS — M170 Bilateral primary osteoarthritis of knee: Secondary | ICD-10-CM | POA: Diagnosis not present

## 2022-04-17 DIAGNOSIS — M170 Bilateral primary osteoarthritis of knee: Secondary | ICD-10-CM | POA: Diagnosis not present

## 2022-04-22 DIAGNOSIS — M170 Bilateral primary osteoarthritis of knee: Secondary | ICD-10-CM | POA: Diagnosis not present

## 2022-04-23 DIAGNOSIS — M79672 Pain in left foot: Secondary | ICD-10-CM | POA: Diagnosis not present

## 2022-04-23 DIAGNOSIS — M79675 Pain in left toe(s): Secondary | ICD-10-CM | POA: Diagnosis not present

## 2022-04-23 DIAGNOSIS — L03032 Cellulitis of left toe: Secondary | ICD-10-CM | POA: Diagnosis not present

## 2022-05-01 DIAGNOSIS — M170 Bilateral primary osteoarthritis of knee: Secondary | ICD-10-CM | POA: Diagnosis not present

## 2022-05-06 DIAGNOSIS — M170 Bilateral primary osteoarthritis of knee: Secondary | ICD-10-CM | POA: Diagnosis not present

## 2022-05-09 DIAGNOSIS — M170 Bilateral primary osteoarthritis of knee: Secondary | ICD-10-CM | POA: Diagnosis not present

## 2022-05-13 DIAGNOSIS — M170 Bilateral primary osteoarthritis of knee: Secondary | ICD-10-CM | POA: Diagnosis not present

## 2022-05-14 DIAGNOSIS — G4733 Obstructive sleep apnea (adult) (pediatric): Secondary | ICD-10-CM | POA: Diagnosis not present

## 2022-05-15 DIAGNOSIS — G4733 Obstructive sleep apnea (adult) (pediatric): Secondary | ICD-10-CM | POA: Diagnosis not present

## 2022-05-16 DIAGNOSIS — M170 Bilateral primary osteoarthritis of knee: Secondary | ICD-10-CM | POA: Diagnosis not present

## 2022-05-20 DIAGNOSIS — M170 Bilateral primary osteoarthritis of knee: Secondary | ICD-10-CM | POA: Diagnosis not present

## 2022-05-30 DIAGNOSIS — M170 Bilateral primary osteoarthritis of knee: Secondary | ICD-10-CM | POA: Diagnosis not present

## 2022-06-05 DIAGNOSIS — M170 Bilateral primary osteoarthritis of knee: Secondary | ICD-10-CM | POA: Diagnosis not present

## 2022-06-13 DIAGNOSIS — M170 Bilateral primary osteoarthritis of knee: Secondary | ICD-10-CM | POA: Diagnosis not present

## 2022-06-19 DIAGNOSIS — M170 Bilateral primary osteoarthritis of knee: Secondary | ICD-10-CM | POA: Diagnosis not present

## 2022-07-03 DIAGNOSIS — M170 Bilateral primary osteoarthritis of knee: Secondary | ICD-10-CM | POA: Diagnosis not present

## 2022-09-24 ENCOUNTER — Other Ambulatory Visit: Payer: Self-pay | Admitting: Family Medicine

## 2022-09-24 DIAGNOSIS — Z1231 Encounter for screening mammogram for malignant neoplasm of breast: Secondary | ICD-10-CM

## 2022-10-03 ENCOUNTER — Ambulatory Visit
Admission: RE | Admit: 2022-10-03 | Discharge: 2022-10-03 | Disposition: A | Discharge status: Home / Self Care | Payer: Medicare Other | Source: Ambulatory Visit | Attending: Family Medicine | Admitting: Family Medicine

## 2022-10-03 DIAGNOSIS — Z1231 Encounter for screening mammogram for malignant neoplasm of breast: Secondary | ICD-10-CM

## 2022-10-31 ENCOUNTER — Ambulatory Visit: Payer: Medicare Other | Admitting: Neurology

## 2022-10-31 ENCOUNTER — Encounter: Payer: Self-pay | Admitting: Neurology

## 2022-10-31 VITALS — BP 150/81 | HR 59 | Ht 61.0 in | Wt 223.2 lb

## 2022-10-31 DIAGNOSIS — H93A3 Pulsatile tinnitus, bilateral: Secondary | ICD-10-CM | POA: Diagnosis not present

## 2022-10-31 DIAGNOSIS — E210 Primary hyperparathyroidism: Secondary | ICD-10-CM | POA: Diagnosis not present

## 2022-10-31 DIAGNOSIS — G4734 Idiopathic sleep related nonobstructive alveolar hypoventilation: Secondary | ICD-10-CM | POA: Diagnosis not present

## 2022-10-31 DIAGNOSIS — G4733 Obstructive sleep apnea (adult) (pediatric): Secondary | ICD-10-CM

## 2022-10-31 MED ORDER — VALSARTAN 160 MG PO TABS: 160.0000 mg | ORAL_TABLET | Freq: Every day | ORAL | Status: DC

## 2022-10-31 MED ORDER — BIOTIN 10 MG/ML PO LIQD: 20.0000 mL | Freq: Two times a day (BID) | ORAL | Status: DC

## 2022-10-31 NOTE — Progress Notes (Signed)
Provider:  Melvyn Novas, MD  Primary Care Physician:  Lindaann Slough, DO [address] [place] [address] [place] [address]     Referring Provider: Suzan Slick, Md [address] [place],  [place] [address]          Chief Complaint according to patient   Patient presents with:                HISTORY OF PRESENT ILLNESS:  Tina [name] is a 67 y.o. female patient -here on 10/31/2022 for Revisit on CPAP.   Third PCP change in 12 months.   Chief concern according to patient :  Here for cpap follow up. Pt said she doesn't like cpap machine, pt said mask shifts and moves when pt gets comfortable in bed. Pt reports she is starting to get tired when driving again, pt reports she reads a swishing sound in head and feel it too.    The patient is a compliant CPAP user 97% average 4-1/2 hours each night with a pressure setting between a minimum of 5 and a maximum of 8 cm of water and 1 cm expiratory relief.  Her AHI is 0/h.  95th percentile pressure is 8 cm water air leaks are minimal 10.6 L a minute.  I do not see any need to change her pressure settings but may be her humidifier settings could be bumped up.  She endorsed geriatric depression score at 4 out of 15 points the fatigue severity scale at 14 out of 63 and the sleepiness score by Epworth is 10 out of 24.  I reviewed her medication she is on aspirin Nexium gabapentin meloxicam and Naprosyn as needed. She now is on three BP medication.   None of these medications would contribute to a dry mouth.      Review of Systems: Out of a complete 14 system review, the patient complains of only the following symptoms, and all other reviewed systems are negative.:  Ms. Cliver reports that she has a very dry mouth when she wakes up feels parched after using CPAP.  She has a history of chronic intermittent hypoxia with obstructive sleep apnea treated with CPAP.  She has a history of tinnitus swishing pulsatile tinnitus -  how likely  are you to doze in the following situations: 0 = not likely, 1 = slight chance, 2 = moderate chance, 3 = high chance   Sitting and Reading? Watching Television? Sitting inactive in a public place (theater or meeting)? As a passenger in a car for an hour without a break? Lying down in the afternoon when circumstances permit? Sitting and talking to someone? Sitting quietly after lunch without alcohol? In a car, while stopped for a few minutes in traffic?   She endorsed geriatric depression score at 4 out of 15 points the fatigue severity scale at 14 out of 63 and the sleepiness score by Epworth is 10 out of 24.  Social History   Socioeconomic History   Marital status: Divorced    Spouse name: Not on file   Number of children: 2   Years of education: Not on file   Highest education level: Associate degree: academic program  Occupational History   Not on file  Tobacco Use   Smoking status: Former    Current packs/day: 0.00    Types: Cigarettes    Quit date: 10/17/1985    Years since quitting: 37.0   Smokeless tobacco: Never  Vaping Use   Vaping status: Never Used  Substance and Sexual Activity   Alcohol use: Yes    Comment: wine 4 times a year maybe   Drug use: Never   Sexual activity: Not on file  Other Topics Concern   Not on file  Social History Narrative   Lives alone   L handed   Caffeine: 1 C of Coffee a day occas   Social Determinants of Health   Financial Resource Strain: Low Risk  (02/13/2022)   Received from [facility], [facility]   Overall Financial Resource Strain (CARDIA)    Difficulty of Paying Living Expenses: Not very hard  Food Insecurity: No Food Insecurity (02/13/2022)   Received from [facility], [facility]   Hunger Vital Sign    Worried About Running Out of Food in the Last Year: Never true    Ran Out of Food in the Last Year: Never true  Transportation Needs: No Transportation Needs (03/28/2021)   Received from [facility], [facility]   [facility] - Transportation    Lack of Transportation (Medical): No    Lack of Transportation (Non-Medical): No  Physical Activity: Not on file  Stress: Not on file  Social Connections: Not on file    Family History  Problem Relation Age of Onset   Stomach cancer Mother    Kidney failure Father    Breast cancer Neg Hx     Past Medical History:  Diagnosis Date   GERD (gastroesophageal reflux disease)    Hypertension    Obesity     Past Surgical History:  Procedure Laterality Date   ABDOMINAL HYSTERECTOMY     BREAST SURGERY     biopsy     Current Outpatient Medications on File Prior to Visit  Medication Sig Dispense Refill   amLODipine (NORVASC) 5 MG tablet Take 5 mg by mouth daily.     aspirin 81 MG tablet Take 81 mg by mouth daily.     atorvastatin (LIPITOR) 20 MG tablet Take 20 mg by mouth daily.     BLACK COHOSH PO Take by mouth.     cetirizine (ZYRTEC) 10 MG chewable tablet Chew 10 mg by mouth daily.     Cholecalciferol (D3) 50 MCG (2000 UT) TABS Take 50 each by mouth daily.     cyanocobalamin (VITAMIN B12) 500 MCG tablet Take 500 mcg by mouth daily.     esomeprazole (NEXIUM) 40 MG capsule Take 40 mg by mouth daily at 12 noon.     Gabapentin 25 MG TABS Take 10 mg by mouth 2 (two) times daily.     Meloxicam 10 MG CAPS Take 10 mg by mouth 1 day or 1 dose.     naproxen (NAPROSYN) 250 MG tablet Take by mouth as needed.      Valerian 500 MG CAPS Take by mouth. 500 mg tablet 2 tablet at bedtime     valsartan (DIOVAN) 160 MG tablet Take 160 mg by mouth daily.     No current facility-administered medications on file prior to visit.    No Known Allergies   DIAGNOSTIC DATA (LABS, IMAGING, TESTING) - I reviewed patient records, labs, notes, testing and imaging myself where available.  No results found for: "WBC", "HGB", "HCT", "MCV", "PLT"    Component Value Date/Time   NA 145 (H) 06/14/2021 1608   K 4.3 06/14/2021 1608   CL 107 (H) 06/14/2021  1608   CO2 24 06/14/2021 1608  GLUCOSE 87 06/14/2021 1608   GLUCOSE 94 08/01/2016 0930   BUN 13 06/14/2021 1608   CREATININE 0.80 06/14/2021 1608   CREATININE 0.88 08/01/2016 0930   CALCIUM 9.5 06/14/2021 1608   PROT 6.7 06/14/2021 1608   ALBUMIN 4.4 06/14/2021 1608   AST 20 06/14/2021 1608   ALT 12 06/14/2021 1608   ALKPHOS 68 06/14/2021 1608   BILITOT 0.3 06/14/2021 1608   No results found for: "CHOL", "HDL", "LDLCALC", "LDLDIRECT", "TRIG", "CHOLHDL" No results found for: "HGBA1C" No results found for: "VITAMINB12" Lab Results  Component Value Date   TSH 2.23 08/01/2016    PHYSICAL EXAM:  Today's Vitals   10/31/22 1450  BP: (!) 150/81  Pulse: (!) 59  Weight: 223 lb 3.2 oz (101.2 kg)  Height: 5\' 1"  (1.549 m)   Body mass index is 42.17 kg/m.   Wt Readings from Last 3 Encounters:  10/31/22 223 lb 3.2 oz (101.2 kg)  10/30/21 227 lb (103 kg)  06/14/21 231 lb (104.8 kg)     Ht Readings from Last 3 Encounters:  10/31/22 5\' 1"  (1.549 m)  10/30/21 5\' 1"  (1.549 m)  06/14/21 5\' 2"  (1.575 m)      General: The patient is awake, alert and appears not in acute distress. The patient is well groomed. Head: Normocephalic, atraumatic.  Neck is supple. Mallampati 1-2,  neck circumference:15.5  inches . Nasal airflow  patent.  Retrognathia is  seen.  Dental status:  Cardiovascular:  Regular rate and cardiac rhythm by pulse,   without distended neck veins. Respiratory: Lungs are clear to auscultation.  Skin:  With evidence of ankle edema, but no  rash. Trunk: The patient's posture is erect.   Neurologic exam : The patient is awake and alert, oriented to place and time.   Memory subjective described as intact.  Attention span & concentration ability appears normal.  Speech is fluent,  without  dysarthria, dysphonia or aphasia.  Mood and affect are appropriate.   Cranial nerves: no loss of smell or taste reported  Pupils are equal and briskly reactive to light.  Funduscopic exam deferred. .  Extraocular movements in vertical and horizontal planes were intact and without nystagmus. No Diplopia. Visual fields by finger perimetry are intact. Hearing was intact to soft voice and finger rubbing.  Tinnitus in both ears - high pitched, non pulsatile   Facial sensation intact to fine touch.  Facial motor strength is symmetric and tongue and uvula move midline.  Neck ROM : rotation, tilt and flexion extension were normal for age and shoulder shrug was symmetrical.    Motor exam:  Symmetric bulk, tone and ROM.   Normal tone without cog- wheeling, symmetric grip strength .  Coordination: Rapid alternating movements in the fingers/hands were of normal speed.  The Finger-to-nose maneuver was intact without evidence of ataxia, dysmetria or tremor.   Gait and station: Patient could rise unassisted from a seated position, walked without assistive device.  Deep tendon reflexes: in the  upper and lower extremities are symmetric and intact.  Babinski response was deferred .     ASSESSMENT AND PLAN 67 y.o. year old female  here with: parathyroidectomy.     1) OSA on CPAP, excellent resolution.   2) dry mouth, unclear why.   3) pulsatile tinnitus -  interfering with sleep. Not related to BP> ordered MRI and MRA to evaluate for pulsatile tinnitus. Reportedly affecting her balance and hearing.    I plan to follow up either personally or  through our NP within 12 months.   I would like to thank Lindaann Slough, DO and Suzan Slick, Md [address] [place],  [place] [address] for allowing me to meet with and to take care of this pleasant patient.  After spending a total time of  30  minutes face to face and additional time for physical and neurologic examination, review of laboratory studies,  personal review of imaging studies, reports and results of other testing and review of referral information / records as far as provided in visit,   Electronically  signed by: Melvyn Novas, MD 10/31/2022 3:16 PM  [facility] Board certified by The [employer] of Sleep Medicine and Diplomate of the [employer] of Sleep Medicine. Board certified In Neurology through the ABPN, Fellow of the [employer] of Neurology.

## 2022-10-31 NOTE — Patient Instructions (Signed)
Tinnitus Tinnitus is when you hear a sound that there's no actual source for. It may sound like ringing in your ears or something else. The sound may be loud, soft, or somewhere in between. It can last for a few seconds or be constant for days. It can come and go. Almost everyone has tinnitus at some point. It's not the same as hearing loss. But you may need to see a health care provider if: It lasts for a long time. It comes back often. You have trouble sleeping and focusing. What are the causes? The cause of tinnitus is often unknown. In some cases, you may get it if: You're around loud noises, such as from machines or music. An object gets stuck in your ear. Earwax builds up in Landscape architect. You drink a lot of alcohol or caffeine. You take certain medicines. You start to lose your hearing. You may also get it from some medical conditions. These may include: Ear or sinus infections. Heart diseases. High blood pressure. Allergies. Mnire's disease. Problems with your thyroid. A tumor. This is a growth of cells that isn't normal. A weak, bulging blood vessel called an aneurysm near your ear. What increases the risk? You may be more likely to get tinnitus if: You're around loud noises a lot. You're older. You drink alcohol. You smoke. What are the signs or symptoms? The main symptom is hearing a sound that there's no source for. It may sound like ringing. It may also sound like: Buzzing. Sizzling. Blowing air. Hissing. Whistling. Other sounds may include: Roaring. Running water. A musical note. Tapping. Humming. You may have symptoms in one ear or both ears. How is this diagnosed? Tinnitus is diagnosed based on your symptoms, your medical history, and an exam. Your provider may do a full hearing test if your tinnitus: Is in just one ear. Makes it hard for you to hear. Lasts 6 months or longer. You may also need to see an expert in hearing disorders called an audiologist.  They may ask you about your symptoms and how tinnitus affects your daily life. You may have other tests done. These may include: A CT scan. An MRI. An angiogram. This shows how blood flows through your blood vessels. How is this treated? Treatment may include: Therapy to help you manage the stress of living with tinnitus. Finding ways to mask or cover the sound of tinnitus. These include: Sound or white noise machines. Devices that fit in your ear and play sounds or music. A loud humidifier. Acoustic neural stimulation. This is when you use headphones to listen to music that has a special signal in it. Over time, this signal may change some of the pathways in your brain. This can make you less sensitive to tinnitus. This treatment is used for very severe cases. Using hearing aids or cochlear implants if your tinnitus is from hearing loss. If your tinnitus is caused by a medical condition, treating the condition may make it go away.  Follow these instructions at home: Managing symptoms     Try to avoid being in loud places or around loud noises. Wear earplugs or headphones when you're around loud noises. Find ways to reduce stress. These may include meditation, yoga, or deep breathing. Sleep with your head slightly raised. General instructions Take over-the-counter and prescription medicines only as told by your provider. Track the things that cause symptoms (triggers). Try to avoid these things. To stop your tinnitus from getting worse: Do not drink alcohol. Do  not have caffeine. Do not use any products that contain nicotine or tobacco. These products include cigarettes, chewing tobacco, and vaping devices, such as e-cigarettes. If you need help quitting, ask your provider. Avoid using too much salt. Get enough sleep each night. Where to find more information American Tinnitus Association: https://www.johnson-hamilton.org/ Contact a health care provider if: Your symptoms last for 3 weeks or longer without  stopping. You have sudden hearing loss. Your symptoms get worse or don't get better with home care. You can't manage the stress of living with tinnitus. Get help right away if: You get tinnitus after a head injury. You have tinnitus and: Dizziness. Nausea and vomiting. Loss of balance. A sudden, severe headache. Changes to your eyesight. Weakness in your face, arms, or legs. These symptoms may be an emergency. Get help right away. Call 911. Do not wait to see if the symptoms will go away. Do not drive yourself to the hospital. This information is not intended to replace advice given to you by your health care provider. Make sure you discuss any questions you have with your health care provider. Document Revised: 05/13/2022 Document Reviewed: 05/13/2022 Elsevier Patient Education  2024 [employer].

## 2022-11-06 ENCOUNTER — Telehealth: Payer: Self-pay | Admitting: Neurology

## 2022-11-06 NOTE — Telephone Encounter (Signed)
[employer] medicare NPR sent to [facility] [phone]

## 2022-11-07 NOTE — Telephone Encounter (Signed)
She is scheduled for both on 9/26 at 1pm and 1:45pm.

## 2022-11-27 ENCOUNTER — Telehealth: Payer: Self-pay | Admitting: Neurology

## 2022-11-27 NOTE — Telephone Encounter (Signed)
Received the impression report for the pt. Will send to Dr Vickey Huger for her to review and result for the pt.

## 2022-11-27 NOTE — Telephone Encounter (Signed)
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 The right ICA is patent as are all other vessels  without stenosis or aneurysm.  Moderate stenosis is usually treated by conservative measures, such as :  antiplatelet therapy,  control of cholesterin and of glucose and HTN.    A stent is not used unless a stenosis is symptomatic or over 70%

## 2022-11-28 NOTE — Telephone Encounter (Signed)
Called the patient to review results from the MRI and MRA brain informed the patient that the MRA indicated stenosis in the Left ICA. Stressed the importance making sure Tina [name] follows with primary care for management of blood pressure, blood sugar and cholesterol. The last labs on file were from feb. Pt states that Tina [name] was started on cholesterol med at that visit. Tina [name] sees her PCP in December and strongly encouraged her to have her labs reassessed to make sure they are being better controlled. Pt verbalized understanding. Pt had no questions at this time but was encouraged to call back if questions arise. Tina [name] was appreciative for the information

## 2023-01-20 ENCOUNTER — Other Ambulatory Visit: Payer: Self-pay

## 2023-01-20 DIAGNOSIS — G4733 Obstructive sleep apnea (adult) (pediatric): Secondary | ICD-10-CM

## 2023-01-20 DIAGNOSIS — E66812 Obesity, class 2: Secondary | ICD-10-CM

## 2023-05-21 ENCOUNTER — Encounter (INDEPENDENT_AMBULATORY_CARE_PROVIDER_SITE_OTHER): Payer: Self-pay | Admitting: Internal Medicine

## 2023-05-21 ENCOUNTER — Ambulatory Visit (INDEPENDENT_AMBULATORY_CARE_PROVIDER_SITE_OTHER): Admitting: Internal Medicine

## 2023-05-21 VITALS — BP 114/73 | HR 59 | Temp 98.0°F | Ht 60.0 in | Wt 218.0 lb

## 2023-05-21 DIAGNOSIS — E7800 Pure hypercholesterolemia, unspecified: Secondary | ICD-10-CM | POA: Insufficient documentation

## 2023-05-21 DIAGNOSIS — Z6839 Body mass index (BMI) 39.0-39.9, adult: Secondary | ICD-10-CM | POA: Diagnosis not present

## 2023-05-21 DIAGNOSIS — R7303 Prediabetes: Secondary | ICD-10-CM | POA: Insufficient documentation

## 2023-05-21 DIAGNOSIS — M170 Bilateral primary osteoarthritis of knee: Secondary | ICD-10-CM | POA: Insufficient documentation

## 2023-05-21 DIAGNOSIS — I10 Essential (primary) hypertension: Secondary | ICD-10-CM | POA: Insufficient documentation

## 2023-05-21 DIAGNOSIS — E66812 Obesity, class 2: Secondary | ICD-10-CM

## 2023-05-21 DIAGNOSIS — G4733 Obstructive sleep apnea (adult) (pediatric): Secondary | ICD-10-CM | POA: Diagnosis not present

## 2023-05-21 DIAGNOSIS — Z0289 Encounter for other administrative examinations: Secondary | ICD-10-CM

## 2023-05-21 NOTE — Assessment & Plan Note (Signed)
 Tina [name] has a BMI of 42 and a body fat percentage of 52%. She was referred by her orthopedist to lose weight in preparation for knee replacement surgery. Obesity is contributing to her knee arthritis, hypertension, hyperlipidemia, sleep apnea, and prediabetes. Losing 10-15% of her body weight could significantly improve these conditions. Obesity is a complex disease influenced by genetic, hormonal, and lifestyle factors, requiring long-term management. She was educated on the four pillars of obesity medicine: nutrition therapy, physical activity, behavioral modification, and FDA-approved medications. Challenges include genetic predispositions and the body's protective mechanisms against weight loss.   We reviewed anthropometrics, biometrics, associated medical conditions and contributing factors with patient. she would benefit from a medically tailored reduced calorie nutrional plan based on her REE (resting energy expenditure), which will be determined by indirect calorimetry.  We will also assess for cardiometabolic risk and nutritional derangements via fasting labs at intake appointment.

## 2023-05-21 NOTE — Progress Notes (Signed)
 Office: [phone]  /  Fax: [phone]   Initial Visit  Evanell Elbert Ewings [place] was seen in clinic today to evaluate for obesity. She is interested in losing weight to improve overall health and reduce the risk of weight related complications. She presents today to review program treatment options, initial physical assessment, and evaluation.     Discussed the use of AI scribe software for clinical note transcription with the patient, who gave verbal consent to proceed.  History of Present Illness Jennet L Henrickson is a 68 year old female who presents for an introductory medical weight management visit. She was referred by her orthopedist for weight management to prepare for knee replacement surgery.  Her weight issues began after having her two children, despite having been 'very small' prior to that. Her highest recorded weight was 224 pounds, with a BMI of 42 and a body fat percentage of 52%. She acknowledges that gabapentin, which she has been taking for over a year, may contribute to weight gain.  She has a history of arthritis in both knees, which is exacerbated by her weight, and she experiences pain when walking. Her symptoms have improved with Celebrex and knee injections last month.  She has a history of high blood pressure, for which she is on medication. Her last cholesterol check was in February 2024, showing a total cholesterol of 209 mg/dL, HDL of 60 mg/dL, and LDL of 213 mg/dL. She believes these results were before starting her cholesterol medication.  She has prediabetes, with an A1c of 5.8% two years ago and 5.7% in February 2024. No history of diabetes diagnosis.  She has sleep apnea and uses a CPAP machine, though she does not recall the severity of her condition.  She experiences gastroesophageal reflux disease (GERD) and is on medication for it.  She lives in [place] and finds it stressful to travel for appointments, needing assistance with transportation. 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     She was referred by: Specialist  Current nutrition plan: None  Current level of physical activity: Limited due to chronic pain or orthopedic problems  Current or previous pharmacotherapy: None  Response to medication: Never tried medications   Past medical history includes:   Past Medical History:  Diagnosis Date   GERD (gastroesophageal reflux disease)    Hypertension    Obesity      Objective:   BP 114/73   Pulse (!) 59   Temp 98 F (36.7 C)   Ht 5' (1.524 m)   Wt 218 lb (98.9 kg)   SpO2 98%   BMI 42.58 kg/m  She was weighed on the bioimpedance scale: Body mass index is 42.58 kg/m.  Peak YQMVHQ:469 , Body Fat%:52, Visceral Fat Rating:18, Weight trend over the last 12 months: Decreasing  General:  Alert, oriented and cooperative. Patient is in no acute distress.  Respiratory: Normal respiratory effort, no problems with respiration noted   Gait: able to ambulate independently  Mental Status: Normal mood and affect. Normal behavior. Normal judgment and thought content.   DIAGNOSTIC DATA REVIEWED:  BMET    Component Value Date/Time   NA 145 (H) 06/14/2021 1608   K 4.3 06/14/2021 1608   CL 107 (H) 06/14/2021 1608   CO2 24 06/14/2021 1608   GLUCOSE 87 06/14/2021 1608   GLUCOSE 94 08/01/2016 0930   BUN 13 06/14/2021 1608   CREATININE 0.80 06/14/2021 1608  CREATININE 0.88 08/01/2016 0930   CALCIUM 9.5 06/14/2021 1608   No results found for: "HGBA1C" No results found for: "INSULIN" CBC No results found for: "WBC", "RBC", "HGB", "HCT", "PLT", "MCV", "MCH", "MCHC", "RDW" Iron/TIBC/Ferritin/ %Sat No results found for: "IRON", "TIBC", "FERRITIN", "IRONPCTSAT" Lipid Panel  No results found for: "CHOL", "TRIG", "HDL", "CHOLHDL", "VLDL", "LDLCALC", "LDLDIRECT" Hepatic Function Panel     Component Value Date/Time   PROT 6.7 06/14/2021 1608   ALBUMIN 4.4  06/14/2021 1608   AST 20 06/14/2021 1608   ALT 12 06/14/2021 1608   ALKPHOS 68 06/14/2021 1608   BILITOT 0.3 06/14/2021 1608      Component Value Date/Time   TSH 2.23 08/01/2016 0928     Assessment and Plan:   Class 2 severe obesity due to excess calories with serious comorbidity and body mass index (BMI) of 39.0 to 39.9 in adult [facility]) Assessment & Plan: Willodean has a BMI of 42 and a body fat percentage of 52%. She was referred by her orthopedist to lose weight in preparation for knee replacement surgery. Obesity is contributing to her knee arthritis, hypertension, hyperlipidemia, sleep apnea, and prediabetes. Losing 10-15% of her body weight could significantly improve these conditions. Obesity is a complex disease influenced by genetic, hormonal, and lifestyle factors, requiring long-term management. She was educated on the four pillars of obesity medicine: nutrition therapy, physical activity, behavioral modification, and FDA-approved medications. Challenges include genetic predispositions and the body's protective mechanisms against weight loss.   We reviewed anthropometrics, biometrics, associated medical conditions and contributing factors with patient. she would benefit from a medically tailored reduced calorie nutrional plan based on her REE (resting energy expenditure), which will be determined by indirect calorimetry.  We will also assess for cardiometabolic risk and nutritional derangements via fasting labs at intake appointment.     Primary hypertension Assessment & Plan: Blood pressure at goal for age and risk category.  On amlodipine and valsartan without adverse effects.  Most recent renal parameters reviewed which showed normal electrolytes and kidney function.  Continue current regimen  Continue with weight loss therapy. Losing 10% may improve blood pressure control. Monitor for symptoms of orthostasis while losing weight. Continue current regimen and home monitoring for a  goal blood pressure of 120/80.     Pure hypercholesterolemia Assessment & Plan: Gabby has hyperlipidemia, with a total cholesterol of 209 and an LDL of 121 as of February 1610. She is on cholesterol medication, but her levels have not been rechecked since starting the medication. - Recheck cholesterol levels as part of the intake process.   OSA (obstructive sleep apnea) on CPAP - [facility] Assessment & Plan: On CPAP with reported good compliance. Continue PAP therapy. Losing 15% or more of body weight may improve AHI.      Prediabetes Assessment & Plan: Most recent A1c is 5.7 in Care Everywhere 28  Patient aware of disease state and risk of progression. This may contribute to abnormal cravings, fatigue and diabetic complications without having diabetes.   We have discussed treatment options which include: losing 7 to 10% of body weight, increasing physical activity to a goal of 150 minutes a week at moderate intensity.  She would benefit from a low-carb diet and also pharmacoprophylaxis with either metformin or GLP-1.     Primary osteoarthritis of both knees Assessment & Plan: Patient has problems with osteoarthritis of the knees, is followed by orthopedic surgery who referred her to our clinic for assistance with weight management appreciate she is  required to have a BMI less than 40 to be able to undergo joint replacement surgery.  Losing 10 to 15% of body weight may reduce biomechanical forces and improve pain.           Obesity Treatment / Action Plan:  Patient will work on garnering support from family and friends to begin weight loss journey. Will work on eliminating or reducing the presence of highly palatable, calorie dense foods in the home. Will complete provided nutritional and psychosocial assessment questionnaire before the next appointment. Will be scheduled for indirect calorimetry to determine resting energy expenditure in a fasting state.  This will allow [place]  to create a reduced calorie, high-protein meal plan to promote loss of fat mass while preserving muscle mass. Counseled on the health benefits of losing 5%-15% of total body weight. Was counseled on nutritional approaches to weight loss and benefits of reducing processed foods and consuming plant-based foods and high quality protein as part of nutritional weight management. Was counseled on pharmacotherapy and role as an adjunct in weight management.   Obesity Education Performed Today:  She was weighed on the bioimpedance scale and results were discussed and documented in the synopsis.  We discussed obesity as a disease and the importance of a more detailed evaluation of all the factors contributing to the disease.  We discussed the importance of long term lifestyle changes which include nutrition, exercise and behavioral modifications as well as the importance of customizing this to her specific health and social needs.  We discussed the benefits of reaching a healthier weight to alleviate the symptoms of existing conditions and reduce the risks of the biomechanical, metabolic and psychological effects of obesity.  Lorrene L Monceaux appears to be in the action stage of change and states they are ready to start intensive lifestyle modifications and behavioral modifications.  I have spent 40 minutes in the care of the patient today including: preparing to see patient (e.g. review and interpretation of tests, old notes ), obtaining and/or reviewing separately obtained history, performing a medically appropriate examination or evaluation, counseling and educating the patient, documenting clinical information in the electronic or other health care record, and independently interpreting results and communicating results to the patient, family, or caregiver   Reviewed by clinician on day of visit: allergies, medications, problem list, medical history, surgical history, family history, social history, and  previous encounter notes pertinent to obesity diagnosis.   Worthy Rancher, MD

## 2023-05-21 NOTE — Assessment & Plan Note (Signed)
 Blood pressure at goal for age and risk category.  On amlodipine and valsartan without adverse effects.  Most recent renal parameters reviewed which showed normal electrolytes and kidney function.  Continue current regimen  Continue with weight loss therapy. Losing 10% may improve blood pressure control. Monitor for symptoms of orthostasis while losing weight. Continue current regimen and home monitoring for a goal blood pressure of 120/80.

## 2023-05-21 NOTE — Assessment & Plan Note (Signed)
 Most recent A1c is 5.7 in Care Everywhere 28  Patient aware of disease state and risk of progression. This may contribute to abnormal cravings, fatigue and diabetic complications without having diabetes.   We have discussed treatment options which include: losing 7 to 10% of body weight, increasing physical activity to a goal of 150 minutes a week at moderate intensity.  She would benefit from a low-carb diet and also pharmacoprophylaxis with either metformin or GLP-1.

## 2023-05-21 NOTE — Assessment & Plan Note (Signed)
 Tina [name] has hyperlipidemia, with a total cholesterol of 209 and an LDL of 121 as of February 4098. She is on cholesterol medication, but her levels have not been rechecked since starting the medication. - Recheck cholesterol levels as part of the intake process.

## 2023-05-21 NOTE — Assessment & Plan Note (Signed)
 On CPAP with reported good compliance. Continue PAP therapy. Losing 15% or more of body weight may improve AHI.

## 2023-05-21 NOTE — Assessment & Plan Note (Signed)
 Patient has problems with osteoarthritis of the knees, is followed by orthopedic surgery who referred her to our clinic for assistance with weight management appreciate she is required to have a BMI less than 40 to be able to undergo joint replacement surgery.  Losing 10 to 15% of body weight may reduce biomechanical forces and improve pain.

## 2023-05-28 ENCOUNTER — Other Ambulatory Visit: Payer: Self-pay | Admitting: Family Medicine

## 2023-05-28 ENCOUNTER — Encounter: Payer: Self-pay | Admitting: Family Medicine

## 2023-05-28 ENCOUNTER — Ambulatory Visit (INDEPENDENT_AMBULATORY_CARE_PROVIDER_SITE_OTHER): Admitting: Family Medicine

## 2023-05-28 VITALS — BP 120/74 | HR 57 | Temp 97.9°F | Resp 18 | Ht 61.0 in | Wt 221.9 lb

## 2023-05-28 DIAGNOSIS — M170 Bilateral primary osteoarthritis of knee: Secondary | ICD-10-CM | POA: Diagnosis not present

## 2023-05-28 DIAGNOSIS — Z90710 Acquired absence of both cervix and uterus: Secondary | ICD-10-CM | POA: Insufficient documentation

## 2023-05-28 DIAGNOSIS — K219 Gastro-esophageal reflux disease without esophagitis: Secondary | ICD-10-CM | POA: Insufficient documentation

## 2023-05-28 DIAGNOSIS — E559 Vitamin D deficiency, unspecified: Secondary | ICD-10-CM | POA: Insufficient documentation

## 2023-05-28 DIAGNOSIS — M179 Osteoarthritis of knee, unspecified: Secondary | ICD-10-CM | POA: Insufficient documentation

## 2023-05-28 DIAGNOSIS — M722 Plantar fascial fibromatosis: Secondary | ICD-10-CM | POA: Insufficient documentation

## 2023-05-28 MED ORDER — CELECOXIB 100 MG PO CAPS: 100.0000 mg | ORAL_CAPSULE | Freq: Every day | ORAL | 1 refills | Status: DC

## 2023-05-28 MED ORDER — PANTOPRAZOLE SODIUM 40 MG PO TBEC: 40.0000 mg | DELAYED_RELEASE_TABLET | Freq: Every day | ORAL | 1 refills | Status: DC

## 2023-05-28 NOTE — Progress Notes (Signed)
 Established Office Visit  Subjective    Patient ID: Tina [name], female    DOB: 1956/01/29  Age: 68 y.o. MRN: [id number]  CC:  Chief Complaint  Patient presents with   Establish Care    Patient is here to establish care with previous PCP from [facility], pt not new to provider.She states that she needs refills     HPI Tina [name] presents to establish care. Pt is a pt of mine from [facility] last visit with me in 2023.   Pt has chronic joint pain and is taking Celebrex 100mg  BID. Needs this refilled. She is seeing Dr Emilia Beck for chronic knee pain. She is going through weight loss clinic to help with weight loss in the event she needs knee replacement.  She has hx of GERD and taking protonix 40mg  daily. Needs this refilled.    Outpatient Encounter Medications as of 05/28/2023  Medication Sig   amLODipine (NORVASC) 5 MG tablet Take 5 mg by mouth daily.   aspirin 81 MG tablet Take 81 mg by mouth daily.   atorvastatin (LIPITOR) 20 MG tablet Take 20 mg by mouth daily.   BLACK COHOSH PO Take by mouth.   cetirizine (ZYRTEC) 10 MG chewable tablet Chew 10 mg by mouth daily.   Cholecalciferol (D3) 50 MCG (2000 UT) TABS Take 50 each by mouth daily.   cyanocobalamin (VITAMIN B12) 500 MCG tablet Take 500 mcg by mouth daily.   Echinacea 125 MG TABS Take 2 capsules by mouth at bedtime.   gabapentin (NEURONTIN) 100 MG capsule Take 200 mg by mouth at bedtime.   valsartan (DIOVAN) 160 MG tablet Take 1 tablet (160 mg total) by mouth daily.   [DISCONTINUED] celecoxib (CELEBREX) 100 MG capsule Take 100 mg by mouth 2 (two) times daily.   [DISCONTINUED] meloxicam (MOBIC) 15 MG tablet Take 15 mg by mouth daily as needed.   [DISCONTINUED] pantoprazole (PROTONIX) 40 MG tablet Take 40 mg by mouth daily.   celecoxib (CELEBREX) 100 MG capsule Take 1 capsule (100 mg total) by mouth daily.   pantoprazole (PROTONIX) 40 MG tablet Take 1 tablet (40 mg total) by mouth daily.   [DISCONTINUED] Biotin 10 MG/ML LIQD Take  20 mLs by mouth 2 (two) times daily. (Patient not taking: Reported on 05/28/2023)   [DISCONTINUED] esomeprazole (NEXIUM) 40 MG capsule Take 40 mg by mouth daily at 12 noon. (Patient not taking: Reported on 05/21/2023)   [DISCONTINUED] Gabapentin 25 MG TABS Take 10 mg by mouth 2 (two) times daily.   [DISCONTINUED] ipratropium (ATROVENT) 0.03 % nasal spray Place 2 sprays into both nostrils 3 (three) times daily. (Patient not taking: Reported on 05/28/2023)   [DISCONTINUED] Meloxicam 10 MG CAPS Take 10 mg by mouth 1 day or 1 dose.   [DISCONTINUED] methocarbamol (ROBAXIN) 500 MG tablet Take 1,000 mg by mouth every 8 (eight) hours as needed. (Patient not taking: Reported on 05/28/2023)   [DISCONTINUED] naproxen (NAPROSYN) 250 MG tablet Take by mouth as needed.  (Patient not taking: Reported on 05/21/2023)   [DISCONTINUED] Valerian 500 MG CAPS Take by mouth. 500 mg tablet 2 tablet at bedtime (Patient not taking: Reported on 05/28/2023)   No facility-administered encounter medications on file as of 05/28/2023.    Past Medical History:  Diagnosis Date   GERD (gastroesophageal reflux disease)    Hypertension    Obesity     Past Surgical History:  Procedure Laterality Date   ABDOMINAL HYSTERECTOMY     BREAST SURGERY  biopsy    Family History  Problem Relation Age of Onset   Stomach cancer Mother    Kidney failure Father    Breast cancer Neg Hx     Social History   Socioeconomic History   Marital status: Divorced    Spouse name: Not on file   Number of children: 2   Years of education: Not on file   Highest education level: Associate degree: academic program  Occupational History   Not on file  Tobacco Use   Smoking status: Former    Current packs/day: 0.00    Types: Cigarettes    Quit date: 10/17/1985    Years since quitting: 37.6    Passive exposure: Never   Smokeless tobacco: Never  Vaping Use   Vaping status: Never Used  Substance and Sexual Activity   Alcohol use: Yes    Comment:  wine 4 times a year maybe   Drug use: Never   Sexual activity: Not Currently  Other Topics Concern   Not on file  Social History Narrative   Lives alone   L handed   Caffeine: 1 C of Coffee a day occas   Social Drivers of Health   Financial Resource Strain: Low Risk  (02/13/2022)   [facility]   Overall Financial Resource Strain (CARDIA)    Difficulty of Paying Living Expenses: Not very hard  Food Insecurity: No Food Insecurity (02/04/2023)   Received from [facility]   Hunger Vital Sign    Worried About Running Out of Food in the Last Year: Never true    Ran Out of Food in the Last Year: Never true  Transportation Needs: No Transportation Needs (02/04/2023)   Received from [facility]   PRAPARE - Transportation    Lack of Transportation (Medical): No    Lack of Transportation (Non-Medical): No  Physical Activity: Inactive (02/04/2023)   Received from [facility]   Exercise Vital Sign    Days of Exercise per Week: 0 days    Minutes of Exercise per Session: 0 min  Stress: No Stress Concern Present (02/04/2023)   Received from [facility] Occupational [facility] - Occupational Stress Questionnaire    Feeling of Stress : Not at all  Social Connections: Moderately Integrated (02/04/2023)   Received from [facility]   Social Connection and Isolation Panel [NHANES]    Frequency of Communication with Friends and Family: Three times a week    Frequency of Social Gatherings with Friends and Family: Once a week    Attends Religious Services: More than 4 times per year    Active Member of [employer] or Organizations: Yes    Attends Banker Meetings: 1 to 4 times per year    Marital Status: Divorced  Catering manager Violence: Not At Risk (03/19/2022)   Received from [facility]   Humiliation, Afraid, Rape, and Kick questionnaire    Fear of Current or Ex-Partner: No    Emotionally  Abused: No    Physically Abused: No    Sexually Abused: No    Review of Systems  Gastrointestinal:  Positive for heartburn.  Musculoskeletal:  Positive for joint pain.  All other systems reviewed and are negative.       Objective    BP 120/74   Pulse (!) 57   Temp 97.9 F (36.6 C) (Oral)   Resp 18   Ht  5\' 1"  (1.549 m)   Wt 221 lb 14.4 oz (100.7 kg)   SpO2 98%   BMI 41.93 kg/m   Physical Exam Vitals and nursing note reviewed.  Constitutional:      Appearance: Normal appearance. She is obese.  HENT:     Head: Normocephalic and atraumatic.     Right Ear: External ear normal.     Left Ear: External ear normal.     Nose: Nose normal.     Mouth/Throat:     Mouth: Mucous membranes are moist.     Pharynx: Oropharynx is clear.  Eyes:     Conjunctiva/sclera: Conjunctivae normal.     Pupils: Pupils are equal, round, and reactive to light.  Cardiovascular:     Rate and Rhythm: Normal rate and regular rhythm.     Pulses: Normal pulses.     Heart sounds: Normal heart sounds.  Pulmonary:     Effort: Pulmonary effort is normal.     Breath sounds: Normal breath sounds.  Skin:    General: Skin is warm.     Capillary Refill: Capillary refill takes less than 2 seconds.  Neurological:     General: No focal deficit present.     Mental Status: She is alert and oriented to person, place, and time. Mental status is at baseline.     Gait: Gait abnormal.  Psychiatric:        Mood and Affect: Mood normal.        Behavior: Behavior normal.        Thought Content: Thought content normal.        Judgment: Judgment normal.        Assessment & Plan:   Problem List Items Addressed This Visit       Digestive   Gastroesophageal reflux disease   Relevant Medications   pantoprazole (PROTONIX) 40 MG tablet     Musculoskeletal and Integument   Primary osteoarthritis of both knees - Primary   Relevant Medications   celecoxib (CELEBREX) 100 MG capsule   Primary osteoarthritis  of both knees -     Celecoxib; Take 1 capsule (100 mg total) by mouth daily.  Dispense: 90 capsule; Refill: 1  Gastroesophageal reflux disease without esophagitis -     Pantoprazole Sodium; Take 1 tablet (40 mg total) by mouth daily.  Dispense: 90 tablet; Refill: 1  Chronic bilateral knee pain stable and to continue seeing Dr Emilia Beck. To refill Celebrex 100mg  daily. GERD stable and chronic. Refilled Protonix 40mg  daily. No follow-ups on file.   Suzan Slick, MD

## 2023-06-24 ENCOUNTER — Encounter: Payer: Self-pay | Admitting: Family Medicine

## 2023-06-24 ENCOUNTER — Ambulatory Visit (INDEPENDENT_AMBULATORY_CARE_PROVIDER_SITE_OTHER): Admitting: Family Medicine

## 2023-06-24 VITALS — BP 122/60 | Ht 61.0 in | Wt 222.0 lb

## 2023-06-24 DIAGNOSIS — Z0000 Encounter for general adult medical examination without abnormal findings: Secondary | ICD-10-CM

## 2023-06-24 NOTE — Progress Notes (Signed)
 Subjective:   Tina [name] is a 68 y.o. female who presents for Medicare Annual (Subsequent) preventive examination.  Visit Complete: Virtual I connected with  Tina [name] on 06/24/23 by a audio enabled telemedicine application and verified that I am speaking with the correct person using two identifiers.  Patient Location: Home  Provider Location: Home Office  I discussed the limitations of evaluation and management by telemedicine. The patient expressed understanding and agreed to proceed.  Vital Signs: Because this visit was a virtual/telehealth visit, some criteria may be missing or patient reported. Any vitals not documented were not able to be obtained and vitals that have been documented are patient reported.  Patient Medicare AWV questionnaire was completed by the patient on 06/24/2023; I have confirmed that all information answered by patient is correct and no changes since this date.  Cardiac Risk Factors include: advanced age (>28men, >26 women);obesity (BMI >30kg/m2);hypertension     Objective:    Today's Vitals   06/24/23 1357  BP: 122/60  Weight: 222 lb (100.7 kg)  Height: 5\' 1"  (1.549 m)   Body mass index is 41.95 kg/m.     06/24/2023    2:04 PM 05/28/2023    3:24 PM 10/14/2014    6:39 PM  Advanced Directives  Does Patient Have a Medical Advance Directive? Yes Yes No  Type of Estate agent of [place];Living will Living will   Does patient want to make changes to medical advance directive? No - Patient declined    Copy of Healthcare Power of Attorney in Chart? No - copy requested    Would patient like information on creating a medical advance directive?   Yes - Educational materials given    Current Medications (verified) Outpatient Encounter Medications as of 06/24/2023  Medication Sig   vitamin C (ASCORBIC ACID) 250 MG tablet Take 250 mg by mouth 2 (two) times daily.   amLODipine (NORVASC) 5 MG tablet Take 5 mg by mouth daily.    aspirin 81 MG tablet Take 81 mg by mouth daily.   atorvastatin (LIPITOR) 20 MG tablet Take 20 mg by mouth daily.   BLACK COHOSH PO Take by mouth.   celecoxib  (CELEBREX ) 100 MG capsule Take 1 capsule (100 mg total) by mouth daily.   cetirizine (ZYRTEC) 10 MG chewable tablet Chew 10 mg by mouth daily.   Cholecalciferol (D3) 50 MCG (2000 UT) TABS Take 50 each by mouth daily.   cyanocobalamin (VITAMIN B12) 500 MCG tablet Take 500 mcg by mouth daily.   Echinacea 125 MG TABS Take 2 capsules by mouth at bedtime.   gabapentin (NEURONTIN) 100 MG capsule Take 200 mg by mouth at bedtime.   pantoprazole  (PROTONIX ) 40 MG tablet Take 1 tablet (40 mg total) by mouth daily.   valsartan  (DIOVAN ) 160 MG tablet Take 1 tablet (160 mg total) by mouth daily.   No facility-administered encounter medications on file as of 06/24/2023.    Allergies (verified) Other and Penicillins   History: Past Medical History:  Diagnosis Date   GERD (gastroesophageal reflux disease)    Hypertension    Obesity    Past Surgical History:  Procedure Laterality Date   ABDOMINAL HYSTERECTOMY     BREAST SURGERY     biopsy   Family History  Problem Relation Age of Onset   Stomach cancer Mother    Kidney failure Father    Breast cancer Neg Hx    Social History   Socioeconomic History   Marital status:  Divorced    Spouse name: Not on file   Number of children: 2   Years of education: Not on file   Highest education level: Associate degree: academic program  Occupational History   Not on file  Tobacco Use   Smoking status: Former    Current packs/day: 0.00    Types: Cigarettes    Quit date: 10/17/1985    Years since quitting: 37.7    Passive exposure: Never   Smokeless tobacco: Never  Vaping Use   Vaping status: Never Used  Substance and Sexual Activity   Alcohol use: Not Currently   Drug use: Never   Sexual activity: Not Currently  Other Topics Concern   Not on file  Social History Narrative   Lives alone    L handed   Caffeine: 1 C of Coffee a day occas   Social Drivers of Health   Financial Resource Strain: Low Risk  (06/24/2023)   Overall Financial Resource Strain (CARDIA)    Difficulty of Paying Living Expenses: Not very hard  Food Insecurity: No Food Insecurity (06/24/2023)   Hunger Vital Sign    Worried About Running Out of Food in the Last Year: Never true    Ran Out of Food in the Last Year: Never true  Transportation Needs: No Transportation Needs (06/24/2023)   PRAPARE - Administrator, Civil Service (Medical): No    Lack of Transportation (Non-Medical): No  Physical Activity: Inactive (06/24/2023)   Exercise Vital Sign    Days of Exercise per Week: 0 days    Minutes of Exercise per Session: 0 min  Stress: No Stress Concern Present (06/24/2023)   [employer] of Occupational Health - Occupational Stress Questionnaire    Feeling of Stress : Not at all  Social Connections: Moderately Integrated (02/04/2023)   Received from [facility]   Social Connection and Isolation Panel [NHANES]    Frequency of Communication with Friends and Family: Three times a week    Frequency of Social Gatherings with Friends and Family: Once a week    Attends Religious Services: More than 4 times per year    Active Member of [employer] or Organizations: Yes    Attends Banker Meetings: 1 to 4 times per year    Marital Status: Divorced    Tobacco Counseling Counseling given: Not Answered   Clinical Intake:  Pre-visit preparation completed: No  Pain : No/denies pain     BMI - recorded: 41 Nutritional Status: BMI > 30  Obese Nutritional Risks: None Diabetes: No  How often do you need to have someone help you when you read instructions, pamphlets, or other written materials from your doctor or pharmacy?: 1 - Never What is the last grade level you completed in school?: Associates degree  Interpreter Needed?: No      Activities of Daily Living    06/24/2023     1:58 PM  In your present state of health, do you have any difficulty performing the following activities:  Hearing? 0  Vision? 0  Difficulty concentrating or making decisions? 0  Walking or climbing stairs? 1  Comment OA bilateral knees  Dressing or bathing? 0  Doing errands, shopping? 0  Preparing Food and eating ? N  Using the Toilet? N  In the past six months, have you accidently leaked urine? N  Do you have problems with loss of bowel control? N  Managing your Medications? N  Managing your Finances? N  Housekeeping or  managing your Housekeeping? N    Patient Care Team: Manette Section, MD as PCP - General (Family Medicine)  Indicate any recent Medical Services you may have received from other than [facility] providers in the past year (date may be approximate).     Assessment:   This is a routine wellness examination for Thea.  Hearing/Vision screen No results found.   Goals Addressed             This Visit's Progress    Patient Stated       [employer] class that she's taking and be able to work without stressing over bills      Depression Screen    05/28/2023    3:21 PM 08/01/2016    8:45 AM  PHQ 2/9 Scores  PHQ - 2 Score 0 0  PHQ- 9 Score 2     Fall Risk    06/24/2023    2:05 PM 05/28/2023    3:20 PM 05/28/2023    3:12 PM 08/01/2016    8:45 AM  Fall Risk   Falls in the past year? 0 0 0 No  Number falls in past yr: 0 0 0   Injury with Fall? 0 0 0   Risk for fall due to : No Fall Risks No Fall Risks No Fall Risks   Follow up Falls evaluation completed Falls evaluation completed Falls evaluation completed     MEDICARE RISK AT HOME: Medicare Risk at Home Any stairs in or around the home?: Yes If so, are there any without handrails?: Yes Home free of loose throw rugs in walkways, pet beds, electrical cords, etc?: No Adequate lighting in your home to reduce risk of falls?: Yes Life alert?: No Use of a cane, walker or w/c?: No Grab bars in the bathroom?:  Yes Shower chair or bench in shower?: Yes Elevated toilet seat or a handicapped toilet?: Yes  TIMED UP AND GO:  Was the test performed?  No    Cognitive Function:        06/24/2023    2:07 PM  6CIT Screen  What Year? 0 points  What month? 0 points  What time? 0 points  Count back from 20 0 points  Months in reverse 0 points  Repeat phrase 0 points  Total Score 0 points    Immunizations  There is no immunization history on file for this patient.  TDAP status: Due, Education has been provided regarding the importance of this vaccine. Advised may receive this vaccine at local pharmacy or Health Dept. Aware to provide a copy of the vaccination record if obtained from local pharmacy or Health Dept. Verbalized acceptance and understanding.  Flu Vaccine status: Declined, Education has been provided regarding the importance of this vaccine but patient still declined. Advised may receive this vaccine at local pharmacy or Health Dept. Aware to provide a copy of the vaccination record if obtained from local pharmacy or Health Dept. Verbalized acceptance and understanding.  Pneumococcal vaccine status: Declined,  Education has been provided regarding the importance of this vaccine but patient still declined. Advised may receive this vaccine at local pharmacy or Health Dept. Aware to provide a copy of the vaccination record if obtained from local pharmacy or Health Dept. Verbalized acceptance and understanding.   Covid-19 vaccine status: Declined, Education has been provided regarding the importance of this vaccine but patient still declined. Advised may receive this vaccine at local pharmacy or Health Dept.or vaccine clinic. Aware to provide a copy of  the vaccination record if obtained from local pharmacy or Health Dept. Verbalized acceptance and understanding.  Qualifies for Shingles Vaccine? Yes   Zostavax completed No   Shingrix Completed?: No.    Education has been provided regarding the  importance of this vaccine. Patient has been advised to call insurance company to determine out of pocket expense if they have not yet received this vaccine. Advised may also receive vaccine at local pharmacy or Health Dept. Verbalized acceptance and understanding.  Screening Tests Health Maintenance  Topic Date Due   INFLUENZA VACCINE  09/19/2023   Medicare Annual Wellness (AWV)  06/23/2024   MAMMOGRAM  10/02/2024   Colonoscopy  06/29/2031   DEXA SCAN  Completed   Hepatitis C Screening  Completed   HPV VACCINES  Aged Out   Meningococcal B Vaccine  Aged Out   DTaP/Tdap/Td  Discontinued   Pneumonia Vaccine 83+ Years old  Discontinued   COVID-19 Vaccine  Discontinued   Zoster Vaccines- Shingrix  Discontinued    Health Maintenance  There are no preventive care reminders to display for this patient.   Colorectal cancer screening: Type of screening: Colonoscopy. Completed 06/28/2021. Repeat every 10 years  Mammogram status: Completed 09/2022. Repeat every year  Bone Density status: Completed 03/2022. Results reflect: Bone density results: OSTEOPENIA. Repeat every 2 years.  Lung Cancer Screening: (Low Dose CT Chest recommended if Age 29-80 years, 20 pack-year currently smoking OR have quit w/in 15years.) does not qualify.   Lung Cancer Screening Referral: N/A  Additional Screening:  Hepatitis C Screening: does not qualify; Completed 03/28/2021  Vision Screening: Recommended annual ophthalmology exams for early detection of glaucoma and other disorders of the eye. Is the patient up to date with their annual eye exam?  Yes  Who is the provider or what is the name of the office in which the patient attends annual eye exams? [facility]  If pt is not established with a provider, would they like to be referred to a provider to establish care? No .   Dental Screening: Recommended annual dental exams for proper oral hygiene  Diabetic Foot Exam not diabetic  Community Resource Referral  / Chronic Care Management: CRR required this visit?  No   CCM required this visit?  No     Plan:     I have personally reviewed and noted the following in the patient's chart:   Medical and social history Use of alcohol, tobacco or illicit drugs  Current medications and supplements including opioid prescriptions. Patient is not currently taking opioid prescriptions. Functional ability and status Nutritional status Physical activity Advanced directives List of other physicians Hospitalizations, surgeries, and ER visits in previous 12 months Vitals Screenings to include cognitive, depression, and falls Referrals and appointments  In addition, I have reviewed and discussed with patient certain preventive protocols, quality metrics, and best practice recommendations. A written personalized care plan for preventive services as well as general preventive health recommendations were provided to patient.     Manette Section, MD   06/24/2023   After Visit Summary: (MyChart) Due to this being a telephonic visit, the after visit summary with patients personalized plan was offered to patient via MyChart   Nurse Notes: none

## 2023-07-03 ENCOUNTER — Ambulatory Visit (INDEPENDENT_AMBULATORY_CARE_PROVIDER_SITE_OTHER): Admitting: Family Medicine

## 2023-07-03 ENCOUNTER — Encounter (INDEPENDENT_AMBULATORY_CARE_PROVIDER_SITE_OTHER): Payer: Self-pay | Admitting: Family Medicine

## 2023-07-03 VITALS — BP 110/72 | HR 67 | Temp 98.1°F | Ht 60.0 in | Wt 221.0 lb

## 2023-07-03 DIAGNOSIS — E785 Hyperlipidemia, unspecified: Secondary | ICD-10-CM | POA: Diagnosis not present

## 2023-07-03 DIAGNOSIS — Z1331 Encounter for screening for depression: Secondary | ICD-10-CM

## 2023-07-03 DIAGNOSIS — E538 Deficiency of other specified B group vitamins: Secondary | ICD-10-CM

## 2023-07-03 DIAGNOSIS — G4733 Obstructive sleep apnea (adult) (pediatric): Secondary | ICD-10-CM

## 2023-07-03 DIAGNOSIS — R5383 Other fatigue: Secondary | ICD-10-CM

## 2023-07-03 DIAGNOSIS — R0602 Shortness of breath: Secondary | ICD-10-CM

## 2023-07-03 DIAGNOSIS — I10 Essential (primary) hypertension: Secondary | ICD-10-CM

## 2023-07-03 DIAGNOSIS — E669 Obesity, unspecified: Secondary | ICD-10-CM

## 2023-07-03 DIAGNOSIS — E559 Vitamin D deficiency, unspecified: Secondary | ICD-10-CM

## 2023-07-03 DIAGNOSIS — E7800 Pure hypercholesterolemia, unspecified: Secondary | ICD-10-CM

## 2023-07-03 DIAGNOSIS — R7303 Prediabetes: Secondary | ICD-10-CM

## 2023-07-03 DIAGNOSIS — Z6841 Body Mass Index (BMI) 40.0 and over, adult: Secondary | ICD-10-CM

## 2023-07-03 NOTE — Progress Notes (Signed)
 Office: [phone]  /  Fax: [phone]  WEIGHT SUMMARY AND BIOMETRICS  Anthropometric Measurements Height: 5' (1.524 m) Weight: 221 lb (100.2 kg) BMI (Calculated): 43.16 Starting Weight: 221 lb Peak Weight: 225 lb Waist Measurement : 48 inches   Body Composition  Body Fat %: 52.5 % Fat Mass (lbs): 116.2 lbs Muscle Mass (lbs): 99.8 lbs Total Body Water (lbs): 81.2 lbs Visceral Fat Rating : 19   Other Clinical Data RMR: 1238 Fasting: yes Labs: yes Today's Visit #: 1 Starting Date: 07/03/23 Comments: 1st Visit    Chief Complaint: OBESITY    History of Present Illness Tina [name] is a 68 year old female who presents for evaluation and management of her weight and associated health conditions.  She currently weighs 221 pounds with a BMI of 43.2 and aims to reduce her weight to under 200 pounds by the end of the year. Her body fat percentage is 52.5, and her visceral fat rating is 19. She experiences fatigue and shortness of breath with exercise, both of which have worsened with weight gain.  She has a history of hypertension, with an initial blood pressure reading today of 147/77 mmHg, which improved to 110/72 mmHg upon repeat measurement. She manages hyperlipidemia with Lipitor 20 mg daily. She also has a history of B12 deficiency and takes an over-the-counter B12 supplement, as well as a history of vitamin D  deficiency for which she takes an over-the-counter vitamin D  supplement.  She has sleep apnea and has been using a CPAP machine with nasal pillows for approximately two years, which she uses consistently. Initially, she had difficulty with a full mask due to hot flashes causing water accumulation, leading to a sensation of drowning.  She experiences hot flashes and is currently taking gabapentin and black cohosh, which have helped reduce the severity of the symptoms.  She has a history of prediabetes and is working on decreasing simple carbohydrates in her  diet. She lives alone, is retired, and often skips lunch, sometimes eating brunch instead. She tries not to eat after 5 or 6 PM due to reflux. She reports not eating much and does not engage in stress or comfort eating. She has arthritis in her knees, which improved with previous weight loss.  Modified PHQ-( WNL, 2 Epworth 6, WNL BMR 1519 REE 1238, lower than calculated  ECG NSR      PHYSICAL EXAM:  Blood pressure 110/72, pulse 67, temperature 98.1 F (36.7 C), height 5' (1.524 m), weight 221 lb (100.2 kg), SpO2 97%. Body mass index is 43.16 kg/m.  DIAGNOSTIC DATA REVIEWED:  BMET    Component Value Date/Time   NA 145 (H) 06/14/2021 1608   K 4.3 06/14/2021 1608   CL 107 (H) 06/14/2021 1608   CO2 24 06/14/2021 1608   GLUCOSE 87 06/14/2021 1608   GLUCOSE 94 08/01/2016 0930   BUN 13 06/14/2021 1608   CREATININE 0.80 06/14/2021 1608   CREATININE 0.88 08/01/2016 0930   CALCIUM 9.5 06/14/2021 1608   No results found for: "HGBA1C" No results found for: "INSULIN" Lab Results  Component Value Date   TSH 2.23 08/01/2016   CBC No results found for: "WBC", "RBC", "HGB", "HCT", "PLT", "MCV", "MCH", "MCHC", "RDW" Iron Studies No results found for: "IRON", "TIBC", "FERRITIN", "IRONPCTSAT" Lipid Panel  No results found for: "CHOL", "TRIG", "HDL", "CHOLHDL", "VLDL", "LDLCALC", "LDLDIRECT" Hepatic Function Panel     Component Value Date/Time   PROT 6.7 06/14/2021 1608   ALBUMIN 4.4 06/14/2021 1608  AST 20 06/14/2021 1608   ALT 12 06/14/2021 1608   ALKPHOS 68 06/14/2021 1608   BILITOT 0.3 06/14/2021 1608      Component Value Date/Time   TSH 2.23 08/01/2016 0928   Nutritional Lab Results  Component Value Date   VD25OH 22 (L) 08/01/2016     Assessment and Plan Assessment & Plan Obesity Obesity with a BMI of 43.2 and a weight of 221 pounds. She aims to be under 200 pounds by the end of the year. Obesity involves over 40 mechanisms resisting weight loss, including  reduced metabolism, increased hunger signals, and enhanced fat storage. Emphasized the importance of a personalized eating plan. - Initiate category two eating plan focusing on regular food with meal choices. - Avoid sweet potatoes and all potatoes for two weeks. - Advise against late eating to prevent heartburn. - Permit walking if desired, but no assigned exercise yet. - Reassess in two weeks to evaluate hunger levels, cravings, and overall experience with the eating plan.  Hypertension Hypertension with initial blood pressure of 147/77, improved to 110/72 on repeat measurement. Plans to manage hypertension through diet, exercise, and weight loss. - Order labs to assess current status. - Continue weight loss efforts to improve hypertension.  Hyperlipidemia Hyperlipidemia, currently on Lipitor 20 mg daily. Aims to improve lipid levels through diet, exercise, and weight loss, with the goal of discontinuing medication. - Order labs to assess current lipid levels. - Implement category two eating plan to aid in treating hyperlipidemia.  Prediabetes Prediabetes, working on reducing simple carbohydrates. Plans to address prediabetes through dietary changes and weight loss. - Order labs to assess current status. - Implement category two eating plan to improve prediabetes.  Obstructive Sleep Apnea Obstructive sleep apnea, currently using CPAP with nasal pillows and tolerating well. Plans to address sleep apnea through weight loss and lifestyle modifications. - Continue CPAP use as tolerated. - Focus on diet and exercise to aid in weight loss and improve sleep apnea.  Fatigue Fatigue, worsening with weight gain. Plans to address fatigue through weight loss and lifestyle modifications. - Order labs to assess potential contributing factors. - Focus on diet and exercise to improve fatigue.  Shortness of breath with exercise Shortness of breath with exercise, worsening with weight gain. Plans to  address this through weight loss and improving exercise tolerance. - Focus on diet and exercise to enhance exercise tolerance and reduce shortness of breath.     I have personally spent 46 minutes total time today in preparation, patient care, and documentation for this visit, including the following: review of clinical lab tests; review of medical history, review of eating habits and food preferences and nutritional counseling   She was informed of the importance of frequent follow up visits to maximize her success with intensive lifestyle modifications for her multiple health conditions.    Jasmine Mesi, MD

## 2023-07-04 LAB — CBC WITH DIFFERENTIAL/PLATELET
Basophils Absolute: 0.1 10*3/uL (ref 0.0–0.2)
Basos: 1 %
EOS (ABSOLUTE): 0.2 10*3/uL (ref 0.0–0.4)
Eos: 3 %
Hematocrit: 38.2 % (ref 34.0–46.6)
Hemoglobin: 12.9 g/dL (ref 11.1–15.9)
Immature Grans (Abs): 0 10*3/uL (ref 0.0–0.1)
Immature Granulocytes: 0 %
Lymphocytes Absolute: 2 10*3/uL (ref 0.7–3.1)
Lymphs: 33 %
MCH: 30.5 pg (ref 26.6–33.0)
MCHC: 33.8 g/dL (ref 31.5–35.7)
MCV: 90 fL (ref 79–97)
Monocytes Absolute: 0.4 10*3/uL (ref 0.1–0.9)
Monocytes: 7 %
Neutrophils Absolute: 3.4 10*3/uL (ref 1.4–7.0)
Neutrophils: 56 %
Platelets: 279 10*3/uL (ref 150–450)
RBC: 4.23 x10E6/uL (ref 3.77–5.28)
RDW: 13.3 % (ref 11.7–15.4)
WBC: 6 10*3/uL (ref 3.4–10.8)

## 2023-07-04 LAB — CMP14+EGFR
ALT: 14 IU/L (ref 0–32)
AST: 17 IU/L (ref 0–40)
Albumin: 4.2 g/dL (ref 3.9–4.9)
Alkaline Phosphatase: 74 IU/L (ref 44–121)
BUN/Creatinine Ratio: 15 (ref 12–28)
BUN: 12 mg/dL (ref 8–27)
Bilirubin Total: 0.4 mg/dL (ref 0.0–1.2)
CO2: 21 mmol/L (ref 20–29)
Calcium: 9.7 mg/dL (ref 8.7–10.3)
Chloride: 105 mmol/L (ref 96–106)
Creatinine, Ser: 0.8 mg/dL (ref 0.57–1.00)
Globulin, Total: 2.3 g/dL (ref 1.5–4.5)
Glucose: 85 mg/dL (ref 70–99)
Potassium: 4 mmol/L (ref 3.5–5.2)
Sodium: 143 mmol/L (ref 134–144)
Total Protein: 6.5 g/dL (ref 6.0–8.5)
eGFR: 81 mL/min/{1.73_m2} (ref >59)

## 2023-07-04 LAB — T4, FREE: Free T4: 1.12 ng/dL (ref 0.82–1.77)

## 2023-07-04 LAB — LIPID PANEL WITH LDL/HDL RATIO
Cholesterol, Total: 173 mg/dL (ref 100–199)
HDL: 81 mg/dL (ref >39)
LDL Chol Calc (NIH): 80 mg/dL (ref 0–99)
LDL/HDL Ratio: 1 ratio (ref 0.0–3.2)
Triglycerides: 64 mg/dL (ref 0–149)
VLDL Cholesterol Cal: 12 mg/dL (ref 5–40)

## 2023-07-04 LAB — FOLATE: Folate: 10.6 ng/mL (ref >3.0)

## 2023-07-04 LAB — VITAMIN B12: Vitamin B-12: >2000 pg/mL — ABNORMAL HIGH (ref 232–1245)

## 2023-07-04 LAB — VITAMIN D 25 HYDROXY (VIT D DEFICIENCY, FRACTURES): Vit D, 25-Hydroxy: 40.8 ng/mL (ref 30.0–100.0)

## 2023-07-04 LAB — T3: T3, Total: 118 ng/dL (ref 71–180)

## 2023-07-04 LAB — HEMOGLOBIN A1C
Est. average glucose Bld gHb Est-mCnc: 111 mg/dL
Hgb A1c MFr Bld: 5.5 % (ref 4.8–5.6)

## 2023-07-04 LAB — TSH: TSH: 2.28 u[IU]/mL (ref 0.450–4.500)

## 2023-07-04 LAB — INSULIN, RANDOM: INSULIN: 17.1 u[IU]/mL (ref 2.6–24.9)

## 2023-07-17 ENCOUNTER — Encounter (INDEPENDENT_AMBULATORY_CARE_PROVIDER_SITE_OTHER): Payer: Self-pay | Admitting: Family Medicine

## 2023-07-17 ENCOUNTER — Ambulatory Visit (INDEPENDENT_AMBULATORY_CARE_PROVIDER_SITE_OTHER): Admitting: Family Medicine

## 2023-07-17 VITALS — BP 127/79 | HR 66 | Temp 98.1°F | Ht 60.0 in | Wt 219.0 lb

## 2023-07-17 DIAGNOSIS — G4733 Obstructive sleep apnea (adult) (pediatric): Secondary | ICD-10-CM

## 2023-07-17 DIAGNOSIS — E559 Vitamin D deficiency, unspecified: Secondary | ICD-10-CM | POA: Diagnosis not present

## 2023-07-17 DIAGNOSIS — K5909 Other constipation: Secondary | ICD-10-CM

## 2023-07-17 DIAGNOSIS — K5900 Constipation, unspecified: Secondary | ICD-10-CM

## 2023-07-17 DIAGNOSIS — E669 Obesity, unspecified: Secondary | ICD-10-CM

## 2023-07-17 DIAGNOSIS — Z6841 Body Mass Index (BMI) 40.0 and over, adult: Secondary | ICD-10-CM

## 2023-07-17 DIAGNOSIS — E88819 Insulin resistance, unspecified: Secondary | ICD-10-CM | POA: Diagnosis not present

## 2023-07-17 MED ORDER — VITAMIN D (ERGOCALCIFEROL) 1.25 MG (50000 UNIT) PO CAPS: 50000.0000 [IU] | ORAL_CAPSULE | ORAL | 0 refills | Status: DC

## 2023-07-17 NOTE — Progress Notes (Signed)
 Office: [phone]  /  Fax: [phone]  WEIGHT SUMMARY AND BIOMETRICS  Anthropometric Measurements Height: 5' (1.524 m) Weight: 219 lb (99.3 kg) BMI (Calculated): 42.77 Weight at Last Visit: 221 lb Weight Lost Since Last Visit: 2 lb Weight Gained Since Last Visit: 0 Starting Weight: 221 lb Total Weight Loss (lbs): 2 lb (0.907 kg) Peak Weight: 225 lb   Body Composition  Body Fat %: 49.7 % Fat Mass (lbs): 109 lbs Muscle Mass (lbs): 104.8 lbs Total Body Water (lbs): 81.6 lbs Visceral Fat Rating : 18   Other Clinical Data Fasting: no Labs: no Today's Visit #: 2 Starting Date: 07/03/23    Chief Complaint: OBESITY    History of Present Illness Tina [name] is a 68 year old female with obesity and obstructive sleep apnea who presents for a follow-up to discuss her eating plan and review test results.  She followed the prescribed category two eating plan about fifty percent of the time, resulting in a two-pound weight loss since her last visit. She is not currently exercising as no exercise plan has been assigned yet.  She describes the eating plan as 'a nightmare' and 'the horrible thing I've ever had to try to do in my life.' She found the breakfast options limited and unsatisfactory, missing variety and her preferred foods like oatmeal and yogurt. She struggled with the portion sizes, particularly the amount of meat in the meals, and felt forced to eat more than she wanted. She has made some dietary changes, such as using [place] in her coffee and reducing cream intake, and has tried to eat three smaller meals a day with a snack instead of two big meals.  She experiences constipation, which she manages with dietary choices rather than laxatives. She uses A2 milk due to lactose intolerance and finds that a milkshake can help relieve constipation, though she needs to be at home when consuming it.  She has a history of obstructive sleep apnea for over ten years,  confirmed by a polysomnogram in 2023 showing an apnea index of 10.7 and a hypopnea index of 8.4. She uses a CPAP machine every night for at least four hours, sometimes more, and has missed using it only three times in the past year.  She takes vitamin D  supplements at a dose of 2000 IU daily. She also reports a history of osteopenia.  Her fasting glucose was 85, and her A1c was 5.5. Her fasting insulin  level was 17. She experiences excessive hunger signals, particularly for carbohydrates like potatoes and oatmeal.  Her resting metabolic rate was measured at 2956 calories.      PHYSICAL EXAM:  Blood pressure 127/79, pulse 66, temperature 98.1 F (36.7 C), height 5' (1.524 m), weight 219 lb (99.3 kg), SpO2 98%. Body mass index is 42.77 kg/m.  DIAGNOSTIC DATA REVIEWED:  BMET    Component Value Date/Time   NA 143 07/03/2023 1002   K 4.0 07/03/2023 1002   CL 105 07/03/2023 1002   CO2 21 07/03/2023 1002   GLUCOSE 85 07/03/2023 1002   GLUCOSE 94 08/01/2016 0930   BUN 12 07/03/2023 1002   CREATININE 0.80 07/03/2023 1002   CREATININE 0.88 08/01/2016 0930   CALCIUM 9.7 07/03/2023 1002   Lab Results  Component Value Date   HGBA1C 5.5 07/03/2023   Lab Results  Component Value Date   INSULIN  17.1 07/03/2023   Lab Results  Component Value Date   TSH 2.280 07/03/2023   CBC    Component Value  Date/Time   WBC 6.0 07/03/2023 1002   RBC 4.23 07/03/2023 1002   HGB 12.9 07/03/2023 1002   HCT 38.2 07/03/2023 1002   PLT 279 07/03/2023 1002   MCV 90 07/03/2023 1002   MCH 30.5 07/03/2023 1002   MCHC 33.8 07/03/2023 1002   RDW 13.3 07/03/2023 1002   Iron Studies No results found for: "IRON", "TIBC", "FERRITIN", "IRONPCTSAT" Lipid Panel     Component Value Date/Time   CHOL 173 07/03/2023 1002   TRIG 64 07/03/2023 1002   HDL 81 07/03/2023 1002   LDLCALC 80 07/03/2023 1002   Hepatic Function Panel     Component Value Date/Time   PROT 6.5 07/03/2023 1002   ALBUMIN 4.2  07/03/2023 1002   AST 17 07/03/2023 1002   ALT 14 07/03/2023 1002   ALKPHOS 74 07/03/2023 1002   BILITOT 0.4 07/03/2023 1002      Component Value Date/Time   TSH 2.280 07/03/2023 1002   Nutritional Lab Results  Component Value Date   VD25OH 40.8 07/03/2023   VD25OH 22 (L) 08/01/2016     Assessment and Plan Assessment & Plan Obesity Obesity with a history of difficulty adhering to dietary plans. Current weight loss of 2 pounds over the last two weeks. Struggles with dietary restrictions, particularly with breakfast options and portion sizes. Insulin  resistance contributing to weight gain and hunger signals, particularly for carbohydrates. Metabolic rate lower than expected, contributing to potential weight gain if not managed. Current dietary plan includes adequate protein intake to support metabolism. - Continue with the current dietary plan focusing on adequate protein intake. - Allow flexibility between the current plan and a new option, ensuring adherence to one plan per day. - Encourage journaling of food intake, focusing on calories and protein. 1200 calories and 80 gm protein - Provide a list of high-protein foods and snacks under 100 calories. - Encourage portion control and mindful eating to avoid feeling deprived.  Insulin  resistance Insulin  resistance with fasting insulin  level of 17, indicating increased pancreatic activity to maintain blood glucose control. Contributing to weight gain and increased hunger, particularly for carbohydrates. Early stage on the spectrum towards diabetes, but currently no diabetes diagnosis. - Focus on dietary modifications to manage insulin  resistance, emphasizing protein intake and careful selection of carbohydrates. - Provide educational handout on insulin  resistance.  Obstructive sleep apnea Obstructive sleep apnea with a polysomnogram in 2023 showing an apnea index of 10.7 and a hypopnea index of 8.4. Consistent use of CPAP for at least  four hours per night, with occasional lapses. Adequate CPAP use is crucial to prevent metabolic slowdown and weight gain. - Continue consistent use of CPAP for at least four hours per night. - Continue weight loss to treat OSA  Vitamin D  deficiency Vitamin D  level slightly low despite supplementation with 2000 IU daily. Risk of osteoporosis and fatigue associated with low vitamin D  levels. - Prescribe higher strength vitamin D  to be taken once a week for three months. - Recheck vitamin D  levels after three months. - Hold current over-the-counter vitamin D  supplementation during this period.  Constipation Recent episode of constipation associated with dietary changes. Managed with dietary adjustments rather than medications. -Work on increasing water and fiber in diet     I have personally spent 52 minutes total time today in preparation, patient care, and documentation for this visit, including the following: review of clinical lab tests; review of medical history, review of eating habits and the pathophysiology of obesity and IR and nutritional education  for these health issues   She was informed of the importance of frequent follow up visits to maximize her success with intensive lifestyle modifications for her multiple health conditions.    Jasmine Mesi, MD

## 2023-07-31 ENCOUNTER — Encounter (INDEPENDENT_AMBULATORY_CARE_PROVIDER_SITE_OTHER): Payer: Self-pay | Admitting: Adult Health

## 2023-07-31 ENCOUNTER — Ambulatory Visit (INDEPENDENT_AMBULATORY_CARE_PROVIDER_SITE_OTHER): Admitting: Adult Health

## 2023-07-31 VITALS — BP 132/80 | HR 72 | Temp 97.4°F | Ht 60.0 in | Wt 220.0 lb

## 2023-07-31 DIAGNOSIS — Z6841 Body Mass Index (BMI) 40.0 and over, adult: Secondary | ICD-10-CM

## 2023-07-31 DIAGNOSIS — E66813 Obesity, class 3: Secondary | ICD-10-CM

## 2023-07-31 DIAGNOSIS — G4733 Obstructive sleep apnea (adult) (pediatric): Secondary | ICD-10-CM | POA: Diagnosis not present

## 2023-07-31 DIAGNOSIS — E88819 Insulin resistance, unspecified: Secondary | ICD-10-CM | POA: Diagnosis not present

## 2023-07-31 DIAGNOSIS — E559 Vitamin D deficiency, unspecified: Secondary | ICD-10-CM | POA: Diagnosis not present

## 2023-07-31 MED ORDER — VITAMIN D (ERGOCALCIFEROL) 1.25 MG (50000 UNIT) PO CAPS: 50000.0000 [IU] | ORAL_CAPSULE | ORAL | 0 refills | Status: DC

## 2023-07-31 NOTE — Progress Notes (Signed)
 WEIGHT SUMMARY AND BIOMETRICS  Vitals Temp: (!) 97.4 F (36.3 C) BP: 132/80 Pulse Rate: 72 SpO2: 97 %   Anthropometric Measurements Height: 5' (1.524 m) Weight: 220 lb (99.8 kg) BMI (Calculated): 42.97 Weight at Last Visit: 219 lb Weight Lost Since Last Visit: 0 Weight Gained Since Last Visit: 1 lb Starting Weight: 221 lb Total Weight Loss (lbs): 1 lb (0.454 kg) Peak Weight: 225 lb Waist Measurement : 48 inches   Body Composition  Body Fat %: 51.1 % Fat Mass (lbs): 112.8 lbs Muscle Mass (lbs): 102.6 lbs Total Body Water (lbs): 79.8 lbs Visceral Fat Rating : 18   Other Clinical Data Fasting: no Labs: no Today's Visit #: 3 Starting Date: 07/03/23    Chief Complaint:   OBESITY Tina [name] is here to discuss her progress with her obesity treatment plan.  She is on the keeping a food journal and adhering to recommended goals of 1200 calories and 80g protein and states she is following her eating plan approximately 75 % of the time.  She states she is exercising: None  Interim History:  She was started on Cat 2 MP- found it too restrictive and disgusting. At alst OV at [facility], she was converted to 1200 cal with 80g She feels that the journaling program is much easier to follow and allow for her to avoid food aversions  She states Tell Dr. Alvia Awkward that I tried Oley Berth and it was HORRIBLE!  She has been diligently recording her intake, however not tracking calories or grams of protein. Lengthy discussion and hands on demonstration on how to read labels, account for serving size, and how to track calories/grams of protein  Subjective:   1. Vitamin D  deficiency  Latest Reference Range & Units 07/03/23 10:02  Vitamin D , 25-Hydroxy 30.0 - 100.0 ng/mL 40.8       She is on weekly Ergocalciferol  She has stopped OTC Vit D3 supplementation  2. Insulin  resistance  Latest Reference Range & Units 07/03/23 10:02  Glucose 70 - 99 mg/dL 85  Hemoglobin Z6X 4.8 -  5.6 % 5.5  Est. average glucose Bld gHb Est-mCnc mg/dL 096  INSULIN  2.6 - 24.9 uIU/mL 17.1   She denies polyphagia with the new eating plan parameters She has not been using food scale to measure out serving of protein  3. OSA (obstructive sleep apnea)  Latest Reference Range & Units 07/03/23 10:02  Hemoglobin 11.1 - 15.9 g/dL 04.5  HCT 40.9 - 81.1 % 38.2   H/H stable  07/22/2021 CPAP Titration  DIAGNOSIS  Obstructive Sleep Apnea with hypoxia and strong REM sleep  dependence was completely alleviated at a pressure of only 6 cm  water, under use of an EVORA nasal mask.  normal EKG   PLANS/RECOMMENDATIONS: CPAP therapy compliance is defined as 4  hours or more of nightly use.  This patient will use an autotitration capable device CPAP set  between 5 and 8 cm water, 1 cm EPR, heated humidification and the  Evora nasal mask in medium size.   DISCUSSION: Compliance Rv within 60-90 days of CPAP use. A follow  up appointment will be scheduled in the Sleep Clinic at [facility].   Please call [phone] with any  questions.     Assessment/Plan:   1. Vitamin D  deficiency (Primary) Refill  Vitamin D , Ergocalciferol , (DRISDOL ) 1.25 MG (50000 UNIT) CAPS capsule Take 1 capsule (50,000 Units total) by mouth every 7 (seven) days. Dispense: 5 capsule, Refills: 0 ordered  2. Insulin  resistance Continue healthy eating  3. OSA (obstructive sleep apnea) Continue with weight loss efforts  4. BMI 40.0-44.9, adult (HCC), CURRENT BMI 43.1  Tina [name] is currently in the action stage of change. As such, her goal is to continue with weight loss efforts. She has agreed to keeping a food journal and adhering to recommended goals of 1200 calories and 80g protein.   Exercise goals: No exercise has been prescribed at this time.  Behavioral modification strategies: increasing lean protein intake, decreasing simple carbohydrates, increasing vegetables, increasing water intake, no  skipping meals, meal planning and cooking strategies, keeping healthy foods in the home, ways to avoid boredom eating, planning for success, and keeping a strict food journal.  Katilin has agreed to follow-up with our clinic in 2 weeks. She was informed of the importance of frequent follow-up visits to maximize her success with intensive lifestyle modifications for her multiple health conditions.   Objective:   Blood pressure 132/80, pulse 72, temperature (!) 97.4 F (36.3 C), height 5' (1.524 m), weight 220 lb (99.8 kg), SpO2 97%. Body mass index is 42.97 kg/m.  General: Cooperative, alert, well developed, in no acute distress. HEENT: Conjunctivae and lids unremarkable. Cardiovascular: Regular rhythm.  Lungs: Normal work of breathing. Neurologic: No focal deficits.   Lab Results  Component Value Date   CREATININE 0.80 07/03/2023   BUN 12 07/03/2023   NA 143 07/03/2023   K 4.0 07/03/2023   CL 105 07/03/2023   CO2 21 07/03/2023   Lab Results  Component Value Date   ALT 14 07/03/2023   AST 17 07/03/2023   ALKPHOS 74 07/03/2023   BILITOT 0.4 07/03/2023   Lab Results  Component Value Date   HGBA1C 5.5 07/03/2023   Lab Results  Component Value Date   INSULIN  17.1 07/03/2023   Lab Results  Component Value Date   TSH 2.280 07/03/2023   Lab Results  Component Value Date   CHOL 173 07/03/2023   HDL 81 07/03/2023   LDLCALC 80 07/03/2023   TRIG 64 07/03/2023   Lab Results  Component Value Date   VD25OH 40.8 07/03/2023   VD25OH 22 (L) 08/01/2016   Lab Results  Component Value Date   WBC 6.0 07/03/2023   HGB 12.9 07/03/2023   HCT 38.2 07/03/2023   MCV 90 07/03/2023   PLT 279 07/03/2023   No results found for: IRON, TIBC, FERRITIN  Attestation Statements:   Reviewed by clinician on day of visit: allergies, medications, problem list, medical history, surgical history, family history, social history, and previous encounter notes.  I have reviewed the above  documentation for accuracy and completeness, and I agree with the above. -  Aquan Kope d. Perri Aragones, NP-C

## 2023-08-13 ENCOUNTER — Encounter (INDEPENDENT_AMBULATORY_CARE_PROVIDER_SITE_OTHER): Payer: Self-pay | Admitting: Family Medicine

## 2023-08-13 ENCOUNTER — Ambulatory Visit (INDEPENDENT_AMBULATORY_CARE_PROVIDER_SITE_OTHER): Admitting: Family Medicine

## 2023-08-13 VITALS — BP 112/71 | HR 73 | Temp 98.2°F | Ht 60.0 in | Wt 219.0 lb

## 2023-08-13 DIAGNOSIS — E669 Obesity, unspecified: Secondary | ICD-10-CM

## 2023-08-13 DIAGNOSIS — Z6841 Body Mass Index (BMI) 40.0 and over, adult: Secondary | ICD-10-CM

## 2023-08-13 DIAGNOSIS — I10 Essential (primary) hypertension: Secondary | ICD-10-CM

## 2023-08-13 NOTE — Progress Notes (Signed)
 Office: [phone]  /  Fax: [phone]  WEIGHT SUMMARY AND BIOMETRICS  Anthropometric Measurements Height: 5' (1.524 m) Weight: 219 lb (99.3 kg) BMI (Calculated): 42.77 Weight at Last Visit: 220 lb Weight Lost Since Last Visit: 1 lb Weight Gained Since Last Visit: 0 Starting Weight: 221 lb Total Weight Loss (lbs): 2 lb (0.907 kg) Peak Weight: 225 lb   Body Composition  Body Fat %: 48 % Fat Mass (lbs): 105.4 lbs Muscle Mass (lbs): 108.4 lbs Total Body Water (lbs): 84.6 lbs Visceral Fat Rating : 17   Other Clinical Data Fasting: No Labs: No Today's Visit #: 4 Starting Date: 07/03/23    Chief Complaint: OBESITY   History of Present Illness Tina [name] is a 68 year old female who presents for a follow-up on her obesity treatment plan and progress assessment.  She is adhering to a dietary plan with a goal of 1200 calories and 80 or more grams of protein daily, achieving this 95% of the time. However, she often consumes around 1098 calories on a good day and finds high-protein foods unpalatable. She has eliminated potatoes from her diet and has drastically changed her eating habits.  She has lost one pound in the last two weeks since her last visit. She has not yet been assigned an exercise regimen. She enjoys [place] smoothies, which are high in protein and calories, and uses them as meal replacements, especially when experiencing reflux. She drinks A2 milk due to lactose intolerance and finds it tolerable.  She experiences reflux, which affects her ability to eat solid foods at night, leading her to consume smoothies instead. She drinks a lot of water and has not felt lightheaded or dizzy. No lightheadedness or dizziness.      PHYSICAL EXAM:  Blood pressure 112/71, pulse 73, temperature 98.2 F (36.8 C), height 5' (1.524 m), weight 219 lb (99.3 kg), SpO2 96%. Body mass index is 42.77 kg/m.  DIAGNOSTIC DATA REVIEWED:  BMET    Component Value Date/Time    NA 143 07/03/2023 1002   K 4.0 07/03/2023 1002   CL 105 07/03/2023 1002   CO2 21 07/03/2023 1002   GLUCOSE 85 07/03/2023 1002   GLUCOSE 94 08/01/2016 0930   BUN 12 07/03/2023 1002   CREATININE 0.80 07/03/2023 1002   CREATININE 0.88 08/01/2016 0930   CALCIUM 9.7 07/03/2023 1002   Lab Results  Component Value Date   HGBA1C 5.5 07/03/2023   Lab Results  Component Value Date   INSULIN  17.1 07/03/2023   Lab Results  Component Value Date   TSH 2.280 07/03/2023   CBC    Component Value Date/Time   WBC 6.0 07/03/2023 1002   RBC 4.23 07/03/2023 1002   HGB 12.9 07/03/2023 1002   HCT 38.2 07/03/2023 1002   PLT 279 07/03/2023 1002   MCV 90 07/03/2023 1002   MCH 30.5 07/03/2023 1002   MCHC 33.8 07/03/2023 1002   RDW 13.3 07/03/2023 1002   Iron Studies No results found for: IRON, TIBC, FERRITIN, IRONPCTSAT Lipid Panel     Component Value Date/Time   CHOL 173 07/03/2023 1002   TRIG 64 07/03/2023 1002   HDL 81 07/03/2023 1002   LDLCALC 80 07/03/2023 1002   Hepatic Function Panel     Component Value Date/Time   PROT 6.5 07/03/2023 1002   ALBUMIN 4.2 07/03/2023 1002   AST 17 07/03/2023 1002   ALT 14 07/03/2023 1002   ALKPHOS 74 07/03/2023 1002   BILITOT 0.4 07/03/2023 1002  Component Value Date/Time   TSH 2.280 07/03/2023 1002   Nutritional Lab Results  Component Value Date   VD25OH 40.8 07/03/2023   VD25OH 22 (L) 08/01/2016     Assessment and Plan Assessment & Plan Obesity She adheres to a dietary plan of 1200 calories and 80 grams of protein daily, achieving this 95% of the time but often consuming around 1098 calories. She has lost one pound in the last two weeks. She finds high-protein foods unpalatable and prefers certain protein-rich smoothies. Emphasized the importance of meeting calorie and protein goals to improve metabolism and facilitate weight loss. Discussed exploring enjoyable protein sources and considering exercise options for future  incorporation. Explained that cutting calories too low and not meeting protein goals can lower metabolism. - Continue 1200 calorie and 80 gram protein daily goal. - Incorporate protein-rich smoothies as a meal replacement. - Explore enjoyable protein sources. - Consider potential exercise options for future incorporation. - Discuss exercise plan and progress in three weeks at follow-up visit.  Hypertension Blood pressure is well-controlled at 112/71 mmHg. As weight loss progresses, blood pressure may decrease further, possibly requiring antihypertensive medication adjustments. Advised to monitor for symptoms of dizziness, which could indicate hypotension or dehydration. - Watch for symptoms of dizziness or lightheadedness. - Consider adjusting antihypertensive medication if blood pressure decreases further.     I have personally spent 30 minutes total time today in preparation, patient care, and documentation for this visit, including the following: review of clinical lab tests; review of medical history and nutritional counseling   She was informed of the importance of frequent follow up visits to maximize her success with intensive lifestyle modifications for her multiple health conditions.    Louann Penton, MD

## 2023-08-18 ENCOUNTER — Telehealth: Payer: Self-pay | Admitting: Neurology

## 2023-08-18 NOTE — Telephone Encounter (Addendum)
 OV notes sent to [facility] at [phone], confirmation received.

## 2023-08-18 NOTE — Telephone Encounter (Signed)
 [facility] Jasen:Sent request for CPAP  08/15/23, please provide face to, face notes for patient CPAP supplies for us  to be able to process

## 2023-08-25 ENCOUNTER — Ambulatory Visit (INDEPENDENT_AMBULATORY_CARE_PROVIDER_SITE_OTHER): Admitting: Physician Assistant

## 2023-09-04 ENCOUNTER — Other Ambulatory Visit: Payer: Self-pay | Admitting: Family Medicine

## 2023-09-04 NOTE — Telephone Encounter (Signed)
 Copied from CRM [id number]. Topic: Clinical - Medication Refill >> Sep 04, 2023 12:38 PM Wess RAMAN wrote: Medication: gabapentin (NEURONTIN) 100 MG capsule   Has the patient contacted their pharmacy? No (Agent: If no, request that the patient contact the pharmacy for the refill. If patient does not wish to contact the pharmacy document the reason why and proceed with request.) (Agent: If yes, when and what did the pharmacy advise?)  This is the patient's preferred pharmacy:  [facility] [address] [place] - [address] [place] [address] Phone: [phone] Fax: [phone]  Is this the correct pharmacy for this prescription? Yes If no, delete pharmacy and type the correct one.   Has the prescription been filled recently? Yes  Is the patient out of the medication? Yes  Has the patient been seen for an appointment in the last year OR does the patient have an upcoming appointment? Yes  Can we respond through MyChart? No. Patient would like a text  Agent: Please be advised that Rx refills may take up to 3 business days. We ask that you follow-up with your pharmacy.

## 2023-09-09 ENCOUNTER — Ambulatory Visit (INDEPENDENT_AMBULATORY_CARE_PROVIDER_SITE_OTHER): Admitting: Family Medicine

## 2023-09-09 ENCOUNTER — Encounter (INDEPENDENT_AMBULATORY_CARE_PROVIDER_SITE_OTHER): Payer: Self-pay | Admitting: Family Medicine

## 2023-09-09 VITALS — BP 135/82 | HR 68 | Temp 98.3°F | Ht 60.0 in | Wt 220.0 lb

## 2023-09-09 DIAGNOSIS — E559 Vitamin D deficiency, unspecified: Secondary | ICD-10-CM | POA: Diagnosis not present

## 2023-09-09 DIAGNOSIS — E669 Obesity, unspecified: Secondary | ICD-10-CM

## 2023-09-09 DIAGNOSIS — I10 Essential (primary) hypertension: Secondary | ICD-10-CM | POA: Diagnosis not present

## 2023-09-09 DIAGNOSIS — Z6841 Body Mass Index (BMI) 40.0 and over, adult: Secondary | ICD-10-CM | POA: Diagnosis not present

## 2023-09-09 MED ORDER — VITAMIN D (ERGOCALCIFEROL) 1.25 MG (50000 UNIT) PO CAPS: 50000.0000 [IU] | ORAL_CAPSULE | ORAL | 0 refills | Status: DC

## 2023-09-09 NOTE — Progress Notes (Signed)
 Office: [phone]  /  Fax: [phone]  WEIGHT SUMMARY AND BIOMETRICS  Anthropometric Measurements Height: 5' (1.524 m) Weight: 220 lb (99.8 kg) BMI (Calculated): 42.97 Weight at Last Visit: 219 lb Weight Lost Since Last Visit: 0 Weight Gained Since Last Visit: 2 lb Starting Weight: 221 lb Total Weight Loss (lbs): 1 lb (0.454 kg) Peak Weight: 225 lb   Body Composition  Body Fat %: 48.9 % Fat Mass (lbs): 108 lbs Muscle Mass (lbs): 107 lbs Total Body Water (lbs): 84 lbs Visceral Fat Rating : 17   Other Clinical Data Fasting: no Labs: no Today's Visit #: 5 Starting Date: 07/03/23    Chief Complaint: OBESITY    History of Present Illness Tina [name] is a 68 year old female with obesity and hypertension who presents for obesity treatment and progress assessment.  She is adhering to a journaling plan with a target of 1200 calories and 80 or more grams of protein daily, achieving these goals approximately 85% of the time. She is attempting to increase her physical activity, reporting being more active recently and achieving 4000 steps on several days. However, she finds it challenging to maintain a regular eating schedule, especially when her routine is disrupted by activities such as church and other commitments.  She is managing hypertension with diet, exercise, and medication, currently taking amlodipine and valsartan . Her blood pressure was initially elevated at 147/84 mmHg but improved to 135/82 mmHg on a subsequent reading. She notes discomfort from the blood pressure cuff, which she feels may affect her readings.  She has a history of vitamin D  deficiency and is on prescription vitamin D , requesting a refill during the visit.  In terms of her diet, she finds it difficult to consume 1200 calories daily as she does not eat much food. She focuses on ensuring adequate protein intake, sometimes supplementing with peanut butter. She occasionally struggles with sweet  cravings, mentioning a recent indulgence in bite-sized cupcakes, which she realized were high in calories. She is exploring alternatives to satisfy her sweet tooth while maintaining her dietary goals.      PHYSICAL EXAM:  Blood pressure 135/82, pulse 68, temperature 98.3 F (36.8 C), height 5' (1.524 m), weight 220 lb (99.8 kg), SpO2 98%. Body mass index is 42.97 kg/m.  DIAGNOSTIC DATA REVIEWED:  BMET    Component Value Date/Time   NA 143 07/03/2023 1002   K 4.0 07/03/2023 1002   CL 105 07/03/2023 1002   CO2 21 07/03/2023 1002   GLUCOSE 85 07/03/2023 1002   GLUCOSE 94 08/01/2016 0930   BUN 12 07/03/2023 1002   CREATININE 0.80 07/03/2023 1002   CREATININE 0.88 08/01/2016 0930   CALCIUM 9.7 07/03/2023 1002   Lab Results  Component Value Date   HGBA1C 5.5 07/03/2023   Lab Results  Component Value Date   INSULIN  17.1 07/03/2023   Lab Results  Component Value Date   TSH 2.280 07/03/2023   CBC    Component Value Date/Time   WBC 6.0 07/03/2023 1002   RBC 4.23 07/03/2023 1002   HGB 12.9 07/03/2023 1002   HCT 38.2 07/03/2023 1002   PLT 279 07/03/2023 1002   MCV 90 07/03/2023 1002   MCH 30.5 07/03/2023 1002   MCHC 33.8 07/03/2023 1002   RDW 13.3 07/03/2023 1002   Iron Studies No results found for: IRON, TIBC, FERRITIN, IRONPCTSAT Lipid Panel     Component Value Date/Time   CHOL 173 07/03/2023 1002   TRIG 64 07/03/2023 1002  HDL 81 07/03/2023 1002   LDLCALC 80 07/03/2023 1002   Hepatic Function Panel     Component Value Date/Time   PROT 6.5 07/03/2023 1002   ALBUMIN 4.2 07/03/2023 1002   AST 17 07/03/2023 1002   ALT 14 07/03/2023 1002   ALKPHOS 74 07/03/2023 1002   BILITOT 0.4 07/03/2023 1002      Component Value Date/Time   TSH 2.280 07/03/2023 1002   Nutritional Lab Results  Component Value Date   VD25OH 40.8 07/03/2023   VD25OH 22 (L) 08/01/2016     Assessment and Plan Assessment & Plan Obesity She is on a weight management plan  involving a 1200-calorie diet with at least 80 grams of protein. Reports adherence to the plan about 85% of the time. Despite a 1-pound weight gain, her body fat, including visceral fat, has decreased, indicating positive progress. Challenges include maintaining routine eating habits, especially when out of routine, and managing sweet cravings. Emphasized the importance of fat loss over total weight loss. Discussed that drinking excessive water does not aid in weight loss and that peanut butter is a good option for increasing calorie intake due to its high protein and calorie content. - Continue 1200-calorie diet with at least 80 grams of protein daily - Encourage increased physical activity - Discuss strategies for managing sweet cravings, including protein pudding recipe  Hypertension She is on amlodipine and valsartan  for hypertension. Blood pressure was initially elevated at 147/84 but improved to 135/82 on a second reading. Tight blood pressure cuffs can cause discomfort and falsely elevated readings. Advised to request manual blood pressure checks if discomfort occurs. - Continue amlodipine and valsartan  - Advise requesting manual blood pressure checks if cuff discomfort occurs  Vitamin D  deficiency She is on prescription vitamin D  and requested a refill during the visit. - Refill prescription vitamin D       She was informed of the importance of frequent follow up visits to maximize her success with intensive lifestyle modifications for her multiple health conditions.    Louann Penton, MD

## 2023-09-10 ENCOUNTER — Ambulatory Visit: Admitting: Family Medicine

## 2023-09-11 ENCOUNTER — Encounter: Payer: Self-pay | Admitting: Family Medicine

## 2023-09-11 MED ORDER — GABAPENTIN 100 MG PO CAPS: 200.0000 mg | ORAL_CAPSULE | Freq: Every day | ORAL | 5 refills | Status: DC

## 2023-09-17 ENCOUNTER — Ambulatory Visit: Admitting: Family Medicine

## 2023-09-30 ENCOUNTER — Encounter (INDEPENDENT_AMBULATORY_CARE_PROVIDER_SITE_OTHER): Payer: Self-pay | Admitting: Family Medicine

## 2023-09-30 ENCOUNTER — Ambulatory Visit (INDEPENDENT_AMBULATORY_CARE_PROVIDER_SITE_OTHER): Admitting: Family Medicine

## 2023-09-30 VITALS — BP 148/84 | HR 68 | Temp 98.0°F | Ht 60.0 in | Wt 222.0 lb

## 2023-09-30 DIAGNOSIS — E559 Vitamin D deficiency, unspecified: Secondary | ICD-10-CM | POA: Diagnosis not present

## 2023-09-30 DIAGNOSIS — E669 Obesity, unspecified: Secondary | ICD-10-CM | POA: Diagnosis not present

## 2023-09-30 DIAGNOSIS — E66813 Obesity, class 3: Secondary | ICD-10-CM

## 2023-09-30 DIAGNOSIS — Z6841 Body Mass Index (BMI) 40.0 and over, adult: Secondary | ICD-10-CM

## 2023-09-30 DIAGNOSIS — I10 Essential (primary) hypertension: Secondary | ICD-10-CM

## 2023-09-30 MED ORDER — VITAMIN D (ERGOCALCIFEROL) 1.25 MG (50000 UNIT) PO CAPS
50000.0000 [IU] | ORAL_CAPSULE | ORAL | 0 refills | Status: DC
Start: 2023-09-30 — End: 2024-01-07

## 2023-09-30 NOTE — Progress Notes (Signed)
 Office: [phone]  /  Fax: [phone]  WEIGHT SUMMARY AND BIOMETRICS  Anthropometric Measurements Height: 5' (1.524 m) Weight: 222 lb (100.7 kg) BMI (Calculated): 43.36 Weight at Last Visit: 220 lb Weight Lost Since Last Visit: 0 Weight Gained Since Last Visit: 2 lb Starting Weight: 221 lb Total Weight Loss (lbs): 0 lb (0 kg) Peak Weight: 225 lb   Body Composition  Body Fat %: 51.6 % Fat Mass (lbs): 114.8 lbs Muscle Mass (lbs): 102.2 lbs Total Body Water (lbs): 82.8 lbs Visceral Fat Rating : 18   Other Clinical Data Fasting: no Labs: no Today's Visit #: 6 Starting Date: 07/03/23    Chief Complaint: OBESITY   History of Present Illness Tina [name] is a 68 year old female with obesity and hypertension who presents for obesity treatment and progress assessment.  She is focused on managing her obesity through dietary changes, specifically aiming for a 1200 calorie diet with at least 80 grams of protein daily. She meets these dietary goals approximately 85% of the time and has lost two pounds in the last three weeks. She alternates between Boathouse drinks, which are 400 calories with 30 grams of protein, and Premier Protein drinks, which are 160 calories with 30 grams of protein, to help meet her nutritional targets. She sometimes exceeds her calorie goal but prioritizes protein intake. She is not currently engaging in any exercise.  She is on valsartan  160 mg daily and amlodipine 5 mg daily for hypertension and has not missed any doses of her medication.  She is addressing a vitamin D  deficiency with prescription vitamin D  50,000 IU per week and requests a refill for this medication.      PHYSICAL EXAM:  Blood pressure (!) 148/84, pulse 68, temperature 98 F (36.7 C), height 5' (1.524 m), weight 222 lb (100.7 kg), SpO2 96%. Body mass index is 43.36 kg/m.  DIAGNOSTIC DATA REVIEWED:  BMET    Component Value Date/Time   NA 143 07/03/2023 1002   K 4.0  07/03/2023 1002   CL 105 07/03/2023 1002   CO2 21 07/03/2023 1002   GLUCOSE 85 07/03/2023 1002   GLUCOSE 94 08/01/2016 0930   BUN 12 07/03/2023 1002   CREATININE 0.80 07/03/2023 1002   CREATININE 0.88 08/01/2016 0930   CALCIUM 9.7 07/03/2023 1002   Lab Results  Component Value Date   HGBA1C 5.5 07/03/2023   Lab Results  Component Value Date   INSULIN  17.1 07/03/2023   Lab Results  Component Value Date   TSH 2.280 07/03/2023   CBC    Component Value Date/Time   WBC 6.0 07/03/2023 1002   RBC 4.23 07/03/2023 1002   HGB 12.9 07/03/2023 1002   HCT 38.2 07/03/2023 1002   PLT 279 07/03/2023 1002   MCV 90 07/03/2023 1002   MCH 30.5 07/03/2023 1002   MCHC 33.8 07/03/2023 1002   RDW 13.3 07/03/2023 1002   Iron Studies No results found for: IRON, TIBC, FERRITIN, IRONPCTSAT Lipid Panel     Component Value Date/Time   CHOL 173 07/03/2023 1002   TRIG 64 07/03/2023 1002   HDL 81 07/03/2023 1002   LDLCALC 80 07/03/2023 1002   Hepatic Function Panel     Component Value Date/Time   PROT 6.5 07/03/2023 1002   ALBUMIN 4.2 07/03/2023 1002   AST 17 07/03/2023 1002   ALT 14 07/03/2023 1002   ALKPHOS 74 07/03/2023 1002   BILITOT 0.4 07/03/2023 1002      Component Value Date/Time  TSH 2.280 07/03/2023 1002   Nutritional Lab Results  Component Value Date   VD25OH 40.8 07/03/2023   VD25OH 22 (L) 08/01/2016     Assessment and Plan Assessment & Plan Obesity Obesity management is ongoing with a focus on dietary changes and protein intake. She has lost two pounds in the last three weeks. Current weight is 222 pounds, slightly above her initial weight of 221 pounds. Visceral fat rating improved from 19 to 18. Muscle mass has shown fluctuations but overall improvement. She is meeting dietary goals 85% of the time with a 1200 calorie goal and 80 grams of protein. She is not currently exercising. Discussed the importance of protein timing and its impact on metabolism and  muscle building. Encouraged to use a journaling app for tracking dietary intake. Discussed the benefits of front-loading protein intake earlier in the day based on outcome statistics from a study showing improved muscle building and metabolism. - Encourage use of the 'Lose It' app for dietary tracking. - Continue 1200 calorie diet with 80 grams of protein. - Encourage front-loading protein intake earlier in the day. - Initiate muscle-building exercises, such as chair yoga or other strengthening exercises. - Encourage exercise to muscle exhaustion without overexertion. - Plan for short, frequent exercise sessions, e.g., 10 minutes multiple times a day.  Hypertension Blood pressure was elevated today at 148/84 and 153/85. She is on valsartan  160 mg daily and amlodipine 5 mg daily. Elevated readings may be due to the blood pressure cuff used during the visit. - Request manual blood pressure measurement at future visits.  Vitamin D  deficiency She is on prescription vitamin D  50,000 IU per week. Requests a refill. - Refill prescription vitamin D  50,000 IU weekly. - Recheck vitamin D  levels in one month.      She was informed of the importance of frequent follow up visits to maximize her success with intensive lifestyle modifications for her multiple health conditions.    Louann Penton, MD

## 2023-10-21 ENCOUNTER — Encounter (INDEPENDENT_AMBULATORY_CARE_PROVIDER_SITE_OTHER): Payer: Self-pay | Admitting: Family Medicine

## 2023-10-21 ENCOUNTER — Ambulatory Visit (INDEPENDENT_AMBULATORY_CARE_PROVIDER_SITE_OTHER): Admitting: Family Medicine

## 2023-10-21 VITALS — BP 130/78 | HR 73 | Temp 98.3°F | Ht 60.0 in | Wt 222.0 lb

## 2023-10-21 DIAGNOSIS — Z6841 Body Mass Index (BMI) 40.0 and over, adult: Secondary | ICD-10-CM

## 2023-10-21 DIAGNOSIS — E88819 Insulin resistance, unspecified: Secondary | ICD-10-CM

## 2023-10-21 DIAGNOSIS — E785 Hyperlipidemia, unspecified: Secondary | ICD-10-CM

## 2023-10-21 DIAGNOSIS — E7800 Pure hypercholesterolemia, unspecified: Secondary | ICD-10-CM

## 2023-10-21 DIAGNOSIS — E669 Obesity, unspecified: Secondary | ICD-10-CM

## 2023-10-21 DIAGNOSIS — E559 Vitamin D deficiency, unspecified: Secondary | ICD-10-CM

## 2023-10-21 DIAGNOSIS — I10 Essential (primary) hypertension: Secondary | ICD-10-CM | POA: Diagnosis not present

## 2023-10-21 NOTE — Progress Notes (Signed)
 Office: [phone]  /  Fax: [phone]  WEIGHT SUMMARY AND BIOMETRICS  Anthropometric Measurements Height: 5' (1.524 m) Weight: 222 lb (100.7 kg) BMI (Calculated): 43.36 Weight at Last Visit: 222lb Weight Lost Since Last Visit: 0 Weight Gained Since Last Visit: 0 Starting Weight: 221lb Total Weight Loss (lbs): 0 lb (0 kg) Peak Weight: 225lb   Body Composition  Body Fat %: 50.5 % Fat Mass (lbs): 112.6 lbs Muscle Mass (lbs): 104.6 lbs Total Body Water (lbs): 80.6 lbs Visceral Fat Rating : 18   Other Clinical Data Fasting: no Labs: no Today's Visit #: 7 Starting Date: 07/03/23    Chief Complaint: OBESITY    History of Present Illness Tina [name] is a 68 year old female with obesity and hypertension who presents for obesity treatment and progress assessment.  She is following a journaling plan with a goal of 1200 calories and 80 or more grams of protein daily, achieving these goals about 40% of the time. She is working on increasing her protein intake and feels she is achieving this most of the time. Her focus is on consuming more whole foods, such as fruits and vegetables, and ensuring adequate hydration. She has not been exercising but has maintained her weight over the last two weeks since her last visit.  She experienced a challenging period due to the death of her best friend's son in a car accident, which affected her ability to adhere strictly to her diet plan. Despite this, she managed to maintain her dietary habits as closely as possible, including consuming protein shakes like Boathouse and Premier Protein to meet her nutritional goals. She enjoys chocolate and caramel flavors and sometimes freezes them for better taste.  She is not currently engaging in physical exercise but has explored the gym facilities in her apartment complex, which include a stationary bike, treadmill, and elliptical. She plans to seek assistance in adjusting the stationary bike and  is considering trying the elliptical, as her knees may not tolerate the treadmill. She has also shown interest in resuming Tai Chi classes at the senior center, which she enjoyed before the COVID-19 pandemic.  In addition to obesity, she is being treated for hypertension, which is currently controlled with a blood pressure reading of 130/78. She is taking Novoeight 5 mg daily and valsartan  160 mg daily for her hypertension.  She is also managing vitamin D  deficiency, hyperlipidemia, and insulin  resistance. She is on atorvastatin for hyperlipidemia and has a prescription for vitamin D . Her last A1c was 5.5, and her insulin  level was 17, indicating insulin  resistance.  She is fully retired and considering returning to work or volunteering, as she feels unproductive staying at home. 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 She enjoys Tai Chi and is exploring options to stay active and engaged.      PHYSICAL EXAM:  Blood pressure 130/78, pulse 73, temperature 98.3 F (36.8 C), height 5' (1.524 m), weight 222 lb (100.7 kg), SpO2 94%. Body mass index is 43.36 kg/m.  DIAGNOSTIC DATA REVIEWED:  BMET    Component Value Date/Time   NA 143 07/03/2023 1002   K 4.0 07/03/2023 1002   CL 105 07/03/2023 1002   CO2 21 07/03/2023 1002   GLUCOSE 85 07/03/2023 1002   GLUCOSE 94 08/01/2016 0930   BUN 12 07/03/2023 1002   CREATININE 0.80 07/03/2023 1002   CREATININE 0.88 08/01/2016 0930   CALCIUM 9.7 07/03/2023 1002   Lab Results  Component  Value Date   HGBA1C 5.5 07/03/2023   Lab Results  Component Value Date   INSULIN  17.1 07/03/2023   Lab Results  Component Value Date   TSH 2.280 07/03/2023   CBC    Component Value Date/Time   WBC 6.0 07/03/2023 1002   RBC 4.23 07/03/2023 1002   HGB 12.9 07/03/2023 1002   HCT 38.2 07/03/2023 1002   PLT 279 07/03/2023 1002   MCV 90 07/03/2023 1002   MCH 30.5 07/03/2023 1002   MCHC 33.8 07/03/2023 1002    RDW 13.3 07/03/2023 1002   Iron Studies No results found for: IRON, TIBC, FERRITIN, IRONPCTSAT Lipid Panel     Component Value Date/Time   CHOL 173 07/03/2023 1002   TRIG 64 07/03/2023 1002   HDL 81 07/03/2023 1002   LDLCALC 80 07/03/2023 1002   Hepatic Function Panel     Component Value Date/Time   PROT 6.5 07/03/2023 1002   ALBUMIN 4.2 07/03/2023 1002   AST 17 07/03/2023 1002   ALT 14 07/03/2023 1002   ALKPHOS 74 07/03/2023 1002   BILITOT 0.4 07/03/2023 1002      Component Value Date/Time   TSH 2.280 07/03/2023 1002   Nutritional Lab Results  Component Value Date   VD25OH 40.8 07/03/2023   VD25OH 22 (L) 08/01/2016     Assessment and Plan Assessment & Plan Obesity Obesity management with a focus on dietary changes and protein intake. She is meeting her dietary goals about 40% of the time and is working on increasing protein intake. She has maintained her weight over the last two weeks. Stressful life events have impacted her ability to adhere to her plan, but she has managed to maintain her diet as much as possible. She is not currently exercising but is exploring options for physical activity. - Continue journaling plan with 1200 calories and 80 grams of protein daily - Encourage consumption of whole foods, fruits, and vegetables - Encourage adequate hydration - Explore exercise options including stationary bike and elliptical - Consider chair yoga and Tai Chi for low-impact exercise - Encourage use of protein shakes to meet protein goals  Hypertension Hypertension is currently well-controlled with a blood pressure reading of 130/78 mmHg. She is on Novoeight 5 mg daily and valsartan  160 mg daily. - Continue Novoeight 5 mg daily - Continue valsartan  160 mg daily - Monitor blood pressure regularly  Vitamin D  deficiency Vitamin D  deficiency is being monitored. Previous labs were done in May. - Order labs to assess vitamin D   levels  Hyperlipidemia Hyperlipidemia is being managed with atorvastatin. - Continue atorvastatin - Order labs to assess cholesterol levels  Insulin  resistance Insulin  resistance is being monitored. Previous A1c was 5.5, and insulin  was elevated at 17. Discussion of potential medication options if condition worsens, including medications that aid in blood sugar regulation and may have weight loss benefits. - Order labs to reassess A1c and insulin  levels - Discuss potential medication options if insulin  resistance worsens   Follow-Up Follow-up visit scheduled to review lab results and assess progress. - Schedule follow-up visit on September 30th at 2 PM - Ensure labs are done at least three days before the next visit      She was informed of the importance of frequent follow up visits to maximize her success with intensive lifestyle modifications for her multiple health conditions.    Louann Penton, MD

## 2023-11-06 ENCOUNTER — Ambulatory Visit: Payer: Medicare Other | Admitting: Family Medicine

## 2023-11-10 ENCOUNTER — Other Ambulatory Visit: Payer: Self-pay | Admitting: Family Medicine

## 2023-11-10 DIAGNOSIS — Z1231 Encounter for screening mammogram for malignant neoplasm of breast: Secondary | ICD-10-CM

## 2023-11-11 DIAGNOSIS — E559 Vitamin D deficiency, unspecified: Secondary | ICD-10-CM | POA: Diagnosis not present

## 2023-11-11 DIAGNOSIS — E88819 Insulin resistance, unspecified: Secondary | ICD-10-CM | POA: Diagnosis not present

## 2023-11-11 DIAGNOSIS — I10 Essential (primary) hypertension: Secondary | ICD-10-CM | POA: Diagnosis not present

## 2023-11-11 DIAGNOSIS — E7800 Pure hypercholesterolemia, unspecified: Secondary | ICD-10-CM | POA: Diagnosis not present

## 2023-11-12 LAB — CMP14+EGFR
ALT: 12 IU/L (ref 0–32)
AST: 15 IU/L (ref 0–40)
Albumin: 4.3 g/dL (ref 3.9–4.9)
Alkaline Phosphatase: 74 IU/L (ref 49–135)
BUN/Creatinine Ratio: 19 (ref 12–28)
BUN: 15 mg/dL (ref 8–27)
Bilirubin Total: 0.4 mg/dL (ref 0.0–1.2)
CO2: 21 mmol/L (ref 20–29)
Calcium: 9.2 mg/dL (ref 8.7–10.3)
Chloride: 107 mmol/L — ABNORMAL HIGH (ref 96–106)
Creatinine, Ser: 0.77 mg/dL (ref 0.57–1.00)
Globulin, Total: 2.5 g/dL (ref 1.5–4.5)
Glucose: 88 mg/dL (ref 70–99)
Potassium: 4.1 mmol/L (ref 3.5–5.2)
Sodium: 145 mmol/L — ABNORMAL HIGH (ref 134–144)
Total Protein: 6.8 g/dL (ref 6.0–8.5)
eGFR: 84 mL/min/1.73 (ref >59)

## 2023-11-12 LAB — LIPID PANEL WITH LDL/HDL RATIO
Cholesterol, Total: 148 mg/dL (ref 100–199)
HDL: 62 mg/dL (ref >39)
LDL Chol Calc (NIH): 72 mg/dL (ref 0–99)
LDL/HDL Ratio: 1.2 ratio (ref 0.0–3.2)
Triglycerides: 69 mg/dL (ref 0–149)
VLDL Cholesterol Cal: 14 mg/dL (ref 5–40)

## 2023-11-12 LAB — HEMOGLOBIN A1C
Est. average glucose Bld gHb Est-mCnc: 117 mg/dL
Hgb A1c MFr Bld: 5.7 % — ABNORMAL HIGH (ref 4.8–5.6)

## 2023-11-12 LAB — VITAMIN B12: Vitamin B-12: >2000 pg/mL — ABNORMAL HIGH (ref 232–1245)

## 2023-11-12 LAB — INSULIN, RANDOM: INSULIN: 23.5 u[IU]/mL (ref 2.6–24.9)

## 2023-11-12 LAB — VITAMIN D 25 HYDROXY (VIT D DEFICIENCY, FRACTURES): Vit D, 25-Hydroxy: 61 ng/mL (ref 30.0–100.0)

## 2023-11-18 ENCOUNTER — Encounter (INDEPENDENT_AMBULATORY_CARE_PROVIDER_SITE_OTHER): Payer: Self-pay | Admitting: Family Medicine

## 2023-11-18 ENCOUNTER — Ambulatory Visit (INDEPENDENT_AMBULATORY_CARE_PROVIDER_SITE_OTHER): Admitting: Family Medicine

## 2023-11-18 VITALS — BP 130/77 | HR 55 | Temp 98.2°F | Ht 60.0 in | Wt 225.0 lb

## 2023-11-18 DIAGNOSIS — I10 Essential (primary) hypertension: Secondary | ICD-10-CM

## 2023-11-18 DIAGNOSIS — G4733 Obstructive sleep apnea (adult) (pediatric): Secondary | ICD-10-CM | POA: Diagnosis not present

## 2023-11-18 DIAGNOSIS — E559 Vitamin D deficiency, unspecified: Secondary | ICD-10-CM | POA: Diagnosis not present

## 2023-11-18 DIAGNOSIS — Z6841 Body Mass Index (BMI) 40.0 and over, adult: Secondary | ICD-10-CM

## 2023-11-18 DIAGNOSIS — R7303 Prediabetes: Secondary | ICD-10-CM | POA: Diagnosis not present

## 2023-11-18 DIAGNOSIS — E860 Dehydration: Secondary | ICD-10-CM | POA: Diagnosis not present

## 2023-11-18 DIAGNOSIS — E669 Obesity, unspecified: Secondary | ICD-10-CM

## 2023-11-18 MED ORDER — METFORMIN HCL 500 MG PO TABS: 500.0000 mg | ORAL_TABLET | Freq: Every day | ORAL | 0 refills | Status: DC

## 2023-11-18 NOTE — Progress Notes (Signed)
 Office: [phone]  /  Fax: [phone]  WEIGHT SUMMARY AND BIOMETRICS  Anthropometric Measurements Height: 5' (1.524 m) Weight: 225 lb (102.1 kg) BMI (Calculated): 43.94 Weight at Last Visit: 222 lb Weight Lost Since Last Visit: 0 Weight Gained Since Last Visit: 3 lb Starting Weight: 221 lb Total Weight Loss (lbs): 0 lb (0 kg) Peak Weight: 225 lb   Body Composition  Body Fat %: 52.7 % Fat Mass (lbs): 119 lbs Muscle Mass (lbs): 101.4 lbs Visceral Fat Rating : 19   Other Clinical Data Fasting: no Labs: no Today's Visit #: 8 Starting Date: 07/03/23    Chief Complaint: OBESITY   History of Present Illness Magaret L Meinhardt is a 68 year old female with obesity and hypertension who presents for obesity treatment and progress assessment.  She follows a prescribed journaling plan with a daily intake of 1200 calories and 80 or more grams of protein, adhering to it 90% of the time. She attempts to hydrate adequately and avoid skipping meals but struggles to achieve 7 to 9 hours of sleep per night. She exercises twice a week for 45 minutes, engaging in Tai Chi, strength, and balance exercises. Despite these efforts, she has gained 3 pounds in the last month.  Her resting metabolic rate was last measured in May 2025 at 1238, with a basal metabolic rate calculated at 1543. She is frustrated with her lack of weight loss. She has made significant dietary changes, eliminating salt and sugar, and reports a lack of enjoyment in eating, which was previously a source of pleasure for her.  She is currently on valsartan  160 mg daily and amlodipine 5 mg daily for hypertension. She lives alone and describes feeling lonely, with food previously being her main source of enjoyment. She uses a CPAP machine for sleep apnea and has an upcoming appointment for a follow-up on October 8th.  Her recent lab work shows a slight increase in A1c from 5.5 to 5.7 and elevated insulin  levels, which have  worsened since the last assessment. She reports a family history of kidney disease and notes that she was slightly dehydrated during her last lab work, which may have affected her sodium and chloride levels.      PHYSICAL EXAM:  Blood pressure 130/77, pulse (!) 55, temperature 98.2 F (36.8 C), height 5' (1.524 m), weight 225 lb (102.1 kg), SpO2 98%. Body mass index is 43.94 kg/m.  DIAGNOSTIC DATA REVIEWED:  BMET    Component Value Date/Time   NA 145 (H) 11/11/2023 1202   K 4.1 11/11/2023 1202   CL 107 (H) 11/11/2023 1202   CO2 21 11/11/2023 1202   GLUCOSE 88 11/11/2023 1202   GLUCOSE 94 08/01/2016 0930   BUN 15 11/11/2023 1202   CREATININE 0.77 11/11/2023 1202   CREATININE 0.88 08/01/2016 0930   CALCIUM 9.2 11/11/2023 1202   Lab Results  Component Value Date   HGBA1C 5.7 (H) 11/11/2023   HGBA1C 5.5 07/03/2023   Lab Results  Component Value Date   INSULIN  23.5 11/11/2023   INSULIN  17.1 07/03/2023   Lab Results  Component Value Date   TSH 2.280 07/03/2023   CBC    Component Value Date/Time   WBC 6.0 07/03/2023 1002   RBC 4.23 07/03/2023 1002   HGB 12.9 07/03/2023 1002   HCT 38.2 07/03/2023 1002   PLT 279 07/03/2023 1002   MCV 90 07/03/2023 1002   MCH 30.5 07/03/2023 1002   MCHC 33.8 07/03/2023 1002   RDW 13.3 07/03/2023  1002   Iron Studies No results found for: IRON, TIBC, FERRITIN, IRONPCTSAT Lipid Panel     Component Value Date/Time   CHOL 148 11/11/2023 1202   TRIG 69 11/11/2023 1202   HDL 62 11/11/2023 1202   LDLCALC 72 11/11/2023 1202   Hepatic Function Panel     Component Value Date/Time   PROT 6.8 11/11/2023 1202   ALBUMIN 4.3 11/11/2023 1202   AST 15 11/11/2023 1202   ALT 12 11/11/2023 1202   ALKPHOS 74 11/11/2023 1202   BILITOT 0.4 11/11/2023 1202      Component Value Date/Time   TSH 2.280 07/03/2023 1002   Nutritional Lab Results  Component Value Date   VD25OH 61.0 11/11/2023   VD25OH 40.8 07/03/2023   VD25OH 22 (L)  08/01/2016     Assessment and Plan Assessment & Plan Obesity with insulin  resistance and prediabetes Obesity with insulin  resistance and prediabetes, with recent weight gain of three pounds over the last month. Current dietary plan includes 1200 calories and 80 grams of protein, followed 90% of the time. Resting metabolic rate lower than expected. Insulin  levels elevated, contributing to weight gain and risk of diabetes. - Discussed potential use of GLP-1 medications for weight loss, but she declined due to preference against surgery and concerns about medication costs. - Discussed the role of insulin  in weight gain and the need for more aggressive management to prevent progression to diabetes. - Discussed metformin as a starting medication to improve insulin  sensitivity and aid weight loss. - Emphasized the importance of obtaining dietary protein from real food sources rather than supplements to enhance metabolic rate and weight loss. - Order metabolism test after the holidays. - Start metformin, one pill in the morning after breakfast. - Encourage dietary protein from real food sources rather than supplements. - Schedule follow-up before Thanksgiving.  Essential hypertension Essential hypertension, currently well-controlled with valsartan  160 mg daily and amlodipine 5 mg daily. Blood pressure today is 130/77 mmHg. - Management primarily through diet, exercise, and weight loss.  Obstructive sleep apnea Obstructive sleep apnea managed with CPAP. - Upcoming appointment for reassessment of CPAP efficacy on October 8th. - Discussed potential coverage of GLP-1 medications through Medicare if criteria for obstructive sleep apnea are met.  Vitamin D  deficiency on replacement therapy Vitamin D  deficiency previously managed with weekly supplementation. Current level is 61, which is within the desired range. - Change vitamin D  supplementation to every two weeks.  Mild, transient  dehydration Mild, transient dehydration likely due to inadequate hydration. - Discussed the importance of adequate hydration, especially considering her tendency to limit fluid intake to avoid nocturia. - Encourage adequate hydration throughout the day.     I personally spent a total of 55 minutes in the care of the patient today including preparing to see the patient, performing a medically appropriate evaluation of current problems, placing orders in the EMR, documenting clinical information in the EMR, customized nutritional counseling for their specific health and social needs, discussing results with the patient and educating them on how these results can affect their health and weight, explaining the pathophysiology of obesity and how it is significantly more complex than eat less and exercise more, and discussing emotional eating behaviors and how to make strategies to change these behaviors.    Adyson was informed of the importance of frequent follow up visits to maximize her success with intensive lifestyle modifications for her obesity and obesity related health conditions as recommended by USPSTF and CMS guidelines   Natonya Finstad  Verdon, MD

## 2023-11-18 NOTE — Progress Notes (Signed)
 bmi

## 2023-11-19 ENCOUNTER — Encounter (INDEPENDENT_AMBULATORY_CARE_PROVIDER_SITE_OTHER): Payer: Self-pay | Admitting: Family Medicine

## 2023-11-20 ENCOUNTER — Other Ambulatory Visit: Payer: Self-pay | Admitting: Family Medicine

## 2023-11-20 DIAGNOSIS — K219 Gastro-esophageal reflux disease without esophagitis: Secondary | ICD-10-CM

## 2023-11-20 DIAGNOSIS — M170 Bilateral primary osteoarthritis of knee: Secondary | ICD-10-CM

## 2023-11-25 ENCOUNTER — Ambulatory Visit

## 2023-11-25 NOTE — Patient Instructions (Signed)

## 2023-11-25 NOTE — Progress Notes (Unsigned)
 PATIENT: Tina [name] DOB: May 28, 1955  REASON FOR VISIT: follow up HISTORY FROM: patient  No chief complaint on file.    HISTORY OF PRESENT ILLNESS:  11/25/23 ALL:  Tina [name] is a 68 y.o. female here today for follow up for OSA on [name].      HISTORY: (copied from Dr Dohmeier's previous note)  Tina [name] is a 68 y.o. female patient -here on 10/31/2022 for Revisit on [name].    Third PCP change in 12 months.    Chief concern according to patient :  Here for [name] follow up. Pt said she doesn't like [name] machine, pt said mask shifts and moves when pt gets comfortable in bed. Pt reports she is starting to get tired when driving again, pt reports she reads a swishing sound in head and feel it too.    The patient is a compliant [name] user 97% average 4-1/2 hours each night with a pressure setting between a minimum of 5 and a maximum of 8 cm of water and 1 cm expiratory relief.  Her AHI is 0/h.  95th percentile pressure is 8 cm water air leaks are minimal 10.6 L a minute.  I do not see any need to change her pressure settings but may be her humidifier settings could be bumped up.  She endorsed geriatric depression score at 4 out of 15 points the fatigue severity scale at 14 out of 63 and the sleepiness score by Epworth is 10 out of 24.   I reviewed her medication she is on aspirin Nexium gabapentin  meloxicam and Naprosyn as needed. She now is on three BP medication.   None of these medications would contribute to a dry mouth.   REVIEW OF SYSTEMS: Out of a complete 14 system review of symptoms, the patient complains only of the following symptoms, and all other reviewed systems are negative.  ESS:  ALLERGIES: Allergies  Allergen Reactions   Other     Cillins cause yeast infection   Penicillins Itching    Yeast infection    HOME MEDICATIONS: Outpatient Medications Prior to Visit  Medication Sig Dispense Refill   amLODipine (NORVASC) 5 MG tablet Take 5 mg by mouth  daily.     aspirin 81 MG tablet Take 81 mg by mouth daily.     atorvastatin (LIPITOR) 20 MG tablet Take 20 mg by mouth daily.     BLACK COHOSH PO Take by mouth.     celecoxib  (CELEBREX ) 100 MG capsule Take 1 capsule by mouth once daily 90 capsule 0   cetirizine (ZYRTEC) 10 MG tablet Take 10 mg by mouth daily.     cyanocobalamin (VITAMIN B12) 500 MCG tablet Take 500 mcg by mouth daily.     diphenhydrAMINE (BENADRYL) 12.5 MG/5ML elixir Take 12.5 mg by mouth 4 (four) times daily as needed.     diphenhydramine-acetaminophen  (TYLENOL  PM) 25-500 MG TABS tablet Take 1 tablet by mouth at bedtime as needed.     Echinacea 125 MG TABS Take 2 capsules by mouth at bedtime.     gabapentin  (NEURONTIN ) 100 MG capsule Take 2 capsules (200 mg total) by mouth at bedtime. 60 capsule 5   metFORMIN (GLUCOPHAGE) 500 MG tablet Take 1 tablet (500 mg total) by mouth daily with breakfast. 30 tablet 0   pantoprazole  (PROTONIX ) 40 MG tablet Take 1 tablet by mouth once daily 90 tablet 0   valsartan  (DIOVAN ) 160 MG tablet Take 1 tablet (160 mg total) by mouth daily.  vitamin C (ASCORBIC ACID) 250 MG tablet Take 250 mg by mouth 2 (two) times daily. (Patient taking differently: Take 250 mg by mouth daily.)     Vitamin D , Ergocalciferol , (DRISDOL ) 1.25 MG (50000 UNIT) CAPS capsule Take 1 capsule (50,000 Units total) by mouth every 7 (seven) days. 5 capsule 0   No facility-administered medications prior to visit.    PAST MEDICAL HISTORY: Past Medical History:  Diagnosis Date   Chest pain    Chest pain    GERD (gastroesophageal reflux disease)    Hand pain    Headache    Heartburn    High blood pressure    High cholesterol    Hypertension    Joint pain    Knee pain    Lactose intolerance    Lower extremity edema    Obesity    Sleep apnea    SOBOE (shortness of breath on exertion)    Vitamin D  deficiency     PAST SURGICAL HISTORY: Past Surgical History:  Procedure Laterality Date   ABDOMINAL HYSTERECTOMY      BREAST SURGERY     biopsy   CARPAL TUNNEL RELEASE     PARATHYROIDECTOMY      FAMILY HISTORY: Family History  Problem Relation Age of Onset   Cancer Mother    Stroke Mother    Stomach cancer Mother    Kidney failure Father    Breast cancer Neg Hx     SOCIAL HISTORY: Social History   Socioeconomic History   Marital status: Divorced    Spouse name: Not on file   Number of children: 2   Years of education: Not on file   Highest education level: Associate degree: academic program  Occupational History   Occupation: Retired  Tobacco Use   Smoking status: Former    Current packs/day: 0.00    Types: Cigarettes    Quit date: 10/17/1985    Years since quitting: 38.1    Passive exposure: Never   Smokeless tobacco: Never  Vaping Use   Vaping status: Never Used  Substance and Sexual Activity   Alcohol use: Not Currently   Drug use: Never   Sexual activity: Not Currently  Other Topics Concern   Not on file  Social History Narrative   Lives alone   L handed   Caffeine: 1 C of Coffee a day occas   Social Drivers of Health   Financial Resource Strain: Low Risk  (06/24/2023)   Overall Financial Resource Strain (CARDIA)    Difficulty of Paying Living Expenses: Not very hard  Food Insecurity: No Food Insecurity (06/24/2023)   Hunger Vital Sign    Worried About Running Out of Food in the Last Year: Never true    Ran Out of Food in the Last Year: Never true  Transportation Needs: No Transportation Needs (06/24/2023)   PRAPARE - Administrator, Civil Service (Medical): No    Lack of Transportation (Non-Medical): No  Physical Activity: Inactive (06/24/2023)   Exercise Vital Sign    Days of Exercise per Week: 0 days    Minutes of Exercise per Session: 0 min  Stress: No Stress Concern Present (06/24/2023)   [employer] of Occupational Health - Occupational Stress Questionnaire    Feeling of Stress : Not at all  Social Connections: Moderately Integrated (02/04/2023)    Received from [facility]   Social Connection and Isolation Panel    In a typical week, how many times do you talk on  the phone with family, friends, or neighbors?: Three times a week    How often do you get together with friends or relatives?: Once a week    How often do you attend church or religious services?: More than 4 times per year    Do you belong to any clubs or organizations such as church groups, unions, fraternal or athletic groups, or school groups?: Yes    How often do you attend meetings of the clubs or organizations you belong to?: 1 to 4 times per year    Are you married, widowed, divorced, separated, never married, or living with a partner?: Divorced  Intimate Partner Violence: Not At Risk (06/24/2023)   Humiliation, Afraid, Rape, and Kick questionnaire    Fear of Current or Ex-Partner: No    Emotionally Abused: No    Physically Abused: No    Sexually Abused: No     PHYSICAL EXAM  There were no vitals filed for this visit. There is no height or weight on file to calculate BMI.  Generalized: Well developed, in no acute distress  Cardiology: normal rate and rhythm, no murmur noted Respiratory: clear to auscultation bilaterally  Neurological examination  Mentation: Alert oriented to time, place, history taking. Follows all commands speech and language fluent Cranial nerve II-XII: Pupils were equal round reactive to light. Extraocular movements were full, visual field were full  Motor: The motor testing reveals 5 over 5 strength of all 4 extremities. Good symmetric motor tone is noted throughout.  Gait and station: Gait is normal.    DIAGNOSTIC DATA (LABS, IMAGING, TESTING) - I reviewed patient records, labs, notes, testing and imaging myself where available.      No data to display           Lab Results  Component Value Date   WBC 6.0 07/03/2023   HGB 12.9 07/03/2023   HCT 38.2 07/03/2023   MCV 90 07/03/2023   PLT 279 07/03/2023      Component  Value Date/Time   NA 145 (H) 11/11/2023 1202   K 4.1 11/11/2023 1202   CL 107 (H) 11/11/2023 1202   CO2 21 11/11/2023 1202   GLUCOSE 88 11/11/2023 1202   GLUCOSE 94 08/01/2016 0930   BUN 15 11/11/2023 1202   CREATININE 0.77 11/11/2023 1202   CREATININE 0.88 08/01/2016 0930   CALCIUM 9.2 11/11/2023 1202   PROT 6.8 11/11/2023 1202   ALBUMIN 4.3 11/11/2023 1202   AST 15 11/11/2023 1202   ALT 12 11/11/2023 1202   ALKPHOS 74 11/11/2023 1202   BILITOT 0.4 11/11/2023 1202   Lab Results  Component Value Date   CHOL 148 11/11/2023   HDL 62 11/11/2023   LDLCALC 72 11/11/2023   TRIG 69 11/11/2023   Lab Results  Component Value Date   HGBA1C 5.7 (H) 11/11/2023   Lab Results  Component Value Date   VITAMINB12 >2000 (H) 11/11/2023   Lab Results  Component Value Date   TSH 2.280 07/03/2023     ASSESSMENT AND PLAN 68 y.o. year old female  has a past medical history of Chest pain, Chest pain, GERD (gastroesophageal reflux disease), Hand pain, Headache, Heartburn, High blood pressure, High cholesterol, Hypertension, Joint pain, Knee pain, Lactose intolerance, Lower extremity edema, Obesity, Sleep apnea, SOBOE (shortness of breath on exertion), and Vitamin D  deficiency. here with   No diagnosis found.    Tina [name] is doing well on [name] therapy. Compliance report reveals ***. *** was encouraged to continue using  [name] nightly and for greater than 4 hours each night. We will update supply orders as indicated. Risks of untreated sleep apnea review and education materials provided. Healthy lifestyle habits encouraged. *** will follow up in ***, sooner if needed. *** verbalizes understanding and agreement with this plan.    No orders of the defined types were placed in this encounter.    No orders of the defined types were placed in this encounter.     Greig Forbes, FNP-C 11/25/2023, 4:39 PM [facility] [address] [place], [place] [address] [phone]

## 2023-11-25 NOTE — Progress Notes (Unsigned)
 SABRA

## 2023-11-26 ENCOUNTER — Ambulatory Visit: Admitting: Family Medicine

## 2023-11-26 ENCOUNTER — Encounter: Payer: Self-pay | Admitting: Family Medicine

## 2023-11-26 VITALS — BP 132/74 | HR 69 | Resp 17 | Ht 61.0 in | Wt 228.5 lb

## 2023-11-26 DIAGNOSIS — G4733 Obstructive sleep apnea (adult) (pediatric): Secondary | ICD-10-CM | POA: Diagnosis not present

## 2023-11-29 ENCOUNTER — Encounter (INDEPENDENT_AMBULATORY_CARE_PROVIDER_SITE_OTHER): Payer: Self-pay | Admitting: Family Medicine

## 2023-12-01 NOTE — Telephone Encounter (Signed)
**Note De-identified  [place] Obfuscation** Please advise 

## 2023-12-02 ENCOUNTER — Other Ambulatory Visit (INDEPENDENT_AMBULATORY_CARE_PROVIDER_SITE_OTHER): Payer: Self-pay | Admitting: Family Medicine

## 2023-12-02 DIAGNOSIS — E559 Vitamin D deficiency, unspecified: Secondary | ICD-10-CM

## 2023-12-03 ENCOUNTER — Ambulatory Visit
Admission: RE | Admit: 2023-12-03 | Discharge: 2023-12-03 | Disposition: A | Source: Ambulatory Visit | Attending: Family Medicine

## 2023-12-03 DIAGNOSIS — Z1231 Encounter for screening mammogram for malignant neoplasm of breast: Secondary | ICD-10-CM

## 2023-12-08 ENCOUNTER — Ambulatory Visit: Payer: Self-pay | Admitting: Family Medicine

## 2023-12-09 ENCOUNTER — Ambulatory Visit (INDEPENDENT_AMBULATORY_CARE_PROVIDER_SITE_OTHER): Admitting: Family Medicine

## 2023-12-16 NOTE — Telephone Encounter (Signed)
 Please advise.  Tried calling patient, voicemail was full.

## 2023-12-18 ENCOUNTER — Encounter: Payer: Self-pay | Admitting: Family Medicine

## 2023-12-18 ENCOUNTER — Other Ambulatory Visit: Payer: Self-pay | Admitting: Family Medicine

## 2023-12-18 DIAGNOSIS — I10 Essential (primary) hypertension: Secondary | ICD-10-CM

## 2023-12-18 DIAGNOSIS — E7800 Pure hypercholesterolemia, unspecified: Secondary | ICD-10-CM

## 2023-12-18 MED ORDER — VALSARTAN 160 MG PO TABS: 160.0000 mg | ORAL_TABLET | Freq: Every day | ORAL | 0 refills | Status: DC

## 2023-12-18 MED ORDER — ATORVASTATIN CALCIUM 20 MG PO TABS: 20.0000 mg | ORAL_TABLET | Freq: Every day | ORAL | 0 refills | Status: DC

## 2024-01-06 ENCOUNTER — Ambulatory Visit: Admitting: Family Medicine

## 2024-01-07 ENCOUNTER — Ambulatory Visit (INDEPENDENT_AMBULATORY_CARE_PROVIDER_SITE_OTHER): Payer: Self-pay | Admitting: Family Medicine

## 2024-01-07 ENCOUNTER — Ambulatory Visit (INDEPENDENT_AMBULATORY_CARE_PROVIDER_SITE_OTHER): Admitting: Family Medicine

## 2024-01-07 ENCOUNTER — Encounter (INDEPENDENT_AMBULATORY_CARE_PROVIDER_SITE_OTHER): Payer: Self-pay | Admitting: Family Medicine

## 2024-01-07 VITALS — BP 155/85 | HR 59 | Temp 98.5°F | Ht 60.0 in | Wt 226.0 lb

## 2024-01-07 DIAGNOSIS — E559 Vitamin D deficiency, unspecified: Secondary | ICD-10-CM | POA: Diagnosis not present

## 2024-01-07 DIAGNOSIS — R0602 Shortness of breath: Secondary | ICD-10-CM | POA: Diagnosis not present

## 2024-01-07 DIAGNOSIS — R7303 Prediabetes: Secondary | ICD-10-CM

## 2024-01-07 DIAGNOSIS — G4733 Obstructive sleep apnea (adult) (pediatric): Secondary | ICD-10-CM | POA: Diagnosis not present

## 2024-01-07 DIAGNOSIS — I10 Essential (primary) hypertension: Secondary | ICD-10-CM | POA: Diagnosis not present

## 2024-01-07 DIAGNOSIS — E669 Obesity, unspecified: Secondary | ICD-10-CM

## 2024-01-07 DIAGNOSIS — Z6841 Body Mass Index (BMI) 40.0 and over, adult: Secondary | ICD-10-CM

## 2024-01-07 MED ORDER — VITAMIN D (ERGOCALCIFEROL) 1.25 MG (50000 UNIT) PO CAPS: 50000.0000 [IU] | ORAL_CAPSULE | ORAL | 0 refills | Status: DC

## 2024-01-07 NOTE — Progress Notes (Signed)
 Office: [phone]  /  Fax: [phone]  WEIGHT SUMMARY AND BIOMETRICS  Anthropometric Measurements Height: 5' (1.524 m) Weight: 226 lb (102.5 kg) BMI (Calculated): 44.14 Weight at Last Visit: 225 lb Weight Lost Since Last Visit: 0 Weight Gained Since Last Visit: 1 lb Starting Weight: 221 lb Total Weight Loss (lbs): 0 lb (0 kg) Peak Weight: 225 lb   Body Composition  Body Fat %: 51.6 % Fat Mass (lbs): 116.6 lbs Muscle Mass (lbs): 103.8 lbs Total Body Water (lbs): 81.8 lbs Visceral Fat Rating : 19   Other Clinical Data RMR: 1771 Fasting: yes Labs: yes Today's Visit #: 9 Starting Date: 07/03/23    Chief Complaint: OBESITY    History of Present Illness Tina [name] is a 68 year old female with obesity, prediabetes, and obstructive sleep apnea who presents for obesity treatment and progress assessment.  She has been assigned a journaling plan with twelve hundred calories and eighty grams of protein but has not been able to journal or track that. She is not currently exercising and has gained one pound since her last visit two months ago. Her resting metabolic rate was previously low at 1238, but today's result showed improvement at 1771.  Her blood pressure is elevated, and she is currently on amlodipine 5 mg and valsartan  160 mg for her hypertension.  She was prescribed metformin 500 mg daily for her prediabetes but did not take it due to experiencing epistaxis and swollen lips.  She is being treated for vitamin D  deficiency with prescription ergocalciferol  50,000 IU per week and requests a refill.  She experiences shortness of breath with exercise.  She has obstructive sleep apnea and is working on wearing her CPAP more than four hours, currently achieving closer to six hours. A sleep study in 2023 showed her AHI to be 19.1.      PHYSICAL EXAM:  Blood pressure (!) 155/85, pulse (!) 59, temperature 98.5 F (36.9 C), height 5' (1.524 m), weight 226 lb  (102.5 kg), SpO2 100%. Body mass index is 44.14 kg/m.  DIAGNOSTIC DATA REVIEWED:  BMET    Component Value Date/Time   NA 145 (H) 11/11/2023 1202   K 4.1 11/11/2023 1202   CL 107 (H) 11/11/2023 1202   CO2 21 11/11/2023 1202   GLUCOSE 88 11/11/2023 1202   GLUCOSE 94 08/01/2016 0930   BUN 15 11/11/2023 1202   CREATININE 0.77 11/11/2023 1202   CREATININE 0.88 08/01/2016 0930   CALCIUM 9.2 11/11/2023 1202   Lab Results  Component Value Date   HGBA1C 5.7 (H) 11/11/2023   HGBA1C 5.5 07/03/2023   Lab Results  Component Value Date   INSULIN  23.5 11/11/2023   INSULIN  17.1 07/03/2023   Lab Results  Component Value Date   TSH 2.280 07/03/2023   CBC    Component Value Date/Time   WBC 6.0 07/03/2023 1002   RBC 4.23 07/03/2023 1002   HGB 12.9 07/03/2023 1002   HCT 38.2 07/03/2023 1002   PLT 279 07/03/2023 1002   MCV 90 07/03/2023 1002   MCH 30.5 07/03/2023 1002   MCHC 33.8 07/03/2023 1002   RDW 13.3 07/03/2023 1002   Iron Studies No results found for: IRON, TIBC, FERRITIN, IRONPCTSAT Lipid Panel     Component Value Date/Time   CHOL 148 11/11/2023 1202   TRIG 69 11/11/2023 1202   HDL 62 11/11/2023 1202   LDLCALC 72 11/11/2023 1202   Hepatic Function Panel     Component Value Date/Time   PROT  6.8 11/11/2023 1202   ALBUMIN 4.3 11/11/2023 1202   AST 15 11/11/2023 1202   ALT 12 11/11/2023 1202   ALKPHOS 74 11/11/2023 1202   BILITOT 0.4 11/11/2023 1202      Component Value Date/Time   TSH 2.280 07/03/2023 1002   Nutritional Lab Results  Component Value Date   VD25OH 61.0 11/11/2023   VD25OH 40.8 07/03/2023   VD25OH 22 (L) 08/01/2016     Assessment and Plan Assessment & Plan Obesity Recent weight gain of one pound since last visit. She has not been able to journal or track her diet and is not currently exercising. Resting metabolic rate has improved from 1238 to 1771. Discussed the importance of diet, exercise, and weight loss. Considering Zepbound  for weight management, but she is uncertain about starting it. Discussed the potential benefits of Zepbound for sleep apnea, prediabetes, and obesity. - Sent prescription for Zepbound 2.5 mg and started prior authorization process - Encouraged portion control Smart Choices eating plan with increased vegetables and lean protein - Will reassess readiness to change in 8 weeks  Prediabetes Previously prescribed metformin 500 mg daily, but discontinued due to allergic reaction (swollen lips). Discussed the importance of diet, exercise, and weight loss for management. Zepbound may also benefit prediabetes if started. - Continue diet, exercise, and weight loss efforts - Added swollen lips to allergy list  Obstructive sleep apnea Managed with CPAP. She is working on increasing CPAP usage to more than four hours per night, currently closer to six hours. Sleep study in 2023 showed AHI of 19.1, qualifying for Zepbound coverage. Discussed potential benefits of Zepbound for sleep apnea. - Encouraged continued CPAP use - Discussed potential benefits of Zepbound for sleep apnea  Essential hypertension Blood pressure elevated at 148/84 and recheck at 155/85. Currently on amlodipine 5 mg and valsartan  160 mg. - Continue current antihypertensive regimen  Vitamin D  deficiency Managed with prescription ergocalciferol  50,000 IU weekly. She requests a refill. - Refilled ergocalciferol  50,000 IU weekly  Shortness of breath with exertion Likely due to exercise intolerance. Discussed the importance of diet, exercise, and weight loss to improve exercise tolerance. - Encouraged diet, exercise, and weight loss efforts - IC done today shows improvement     Kumiko was counseled on the importance of maintaining healthy lifestyle habits, including balanced nutrition, regular physical activity, and behavioral modifications, while taking antiobesity medication.  Patient verbalized understanding that medication is an  adjunct to, not a replacement for, lifestyle changes and that the long-term success and weight maintenance depend on continued adherence to these strategies.   Criss was informed of the importance of frequent follow up visits to maximize her success with intensive lifestyle modifications for her obesity and obesity related health conditions as recommended by USPSTF and CMS guidelines   Louann Penton, MD

## 2024-01-08 ENCOUNTER — Encounter (INDEPENDENT_AMBULATORY_CARE_PROVIDER_SITE_OTHER): Payer: Self-pay | Admitting: Family Medicine

## 2024-01-08 ENCOUNTER — Telehealth (INDEPENDENT_AMBULATORY_CARE_PROVIDER_SITE_OTHER): Payer: Self-pay

## 2024-01-08 MED ORDER — ZEPBOUND 2.5 MG/0.5ML ~~LOC~~ SOAJ: 2.5000 mg | SUBCUTANEOUS | 0 refills | Status: DC

## 2024-01-08 NOTE — Telephone Encounter (Signed)
 [employer] is reviewing your PA request. Typically an electronic response will be received within 24-72 hours. To check for an update later, open this request from your dashboard.  You may close this dialog and return to your dashboard to perform other tasks.

## 2024-01-08 NOTE — Addendum Note (Signed)
 Addended by: LAFE BAKER CROME on: 01/08/2024 11:54 AM   Modules accepted: Orders

## 2024-01-08 NOTE — Telephone Encounter (Signed)
 Zepbound 2.5 mg for OSA has been submitted to insurance.  Awaiting determination.

## 2024-01-08 NOTE — Telephone Encounter (Signed)
 Patient Name: Tina [name] Patient DOB: [birth year] Patient ID: [id number] Status of Request: Approve Medication Name: Zepbound Inj 2.5/0.5 GPI/NDC: [id number] Decision Notes: ZEPBOUND INJ 2.5/0.5, use as directed, is approved for a non-formulary exception through 12/31/ 2026 under your Medicare Part D benefit. Reviewed by: R.Ph. If the treating physician would like to discuss this coverage decision with the physician or health care professional reviewer, please call [employer] [facility] Prior Authorization department at [phone].  Patient notified us  that it was approved via my chart.

## 2024-01-12 ENCOUNTER — Encounter: Payer: Self-pay | Admitting: Family Medicine

## 2024-01-12 ENCOUNTER — Ambulatory Visit (INDEPENDENT_AMBULATORY_CARE_PROVIDER_SITE_OTHER): Admitting: Family Medicine

## 2024-01-12 VITALS — BP 122/76 | HR 67 | Ht 60.0 in | Wt 228.0 lb

## 2024-01-12 DIAGNOSIS — R7303 Prediabetes: Secondary | ICD-10-CM

## 2024-01-12 DIAGNOSIS — I10 Essential (primary) hypertension: Secondary | ICD-10-CM

## 2024-01-12 DIAGNOSIS — E7800 Pure hypercholesterolemia, unspecified: Secondary | ICD-10-CM | POA: Diagnosis not present

## 2024-01-12 DIAGNOSIS — M170 Bilateral primary osteoarthritis of knee: Secondary | ICD-10-CM | POA: Diagnosis not present

## 2024-01-12 DIAGNOSIS — K219 Gastro-esophageal reflux disease without esophagitis: Secondary | ICD-10-CM

## 2024-01-12 DIAGNOSIS — N959 Unspecified menopausal and perimenopausal disorder: Secondary | ICD-10-CM

## 2024-01-12 NOTE — Assessment & Plan Note (Signed)
 atorvastatin  20 mg daily Tolerating well, no side effects.  07/03/23: LDL 80 Defer lipid panel at future follow-up with PCP Refill sent 12/18/23

## 2024-01-12 NOTE — Assessment & Plan Note (Signed)
 metformin  500 mg daily- off due to allergic reaction: lip swelling, and rash around mouth, skin cracked.  Discontinued and added as allergy in EMR.  11/11/23: A1C 5.7 [facility]: prescribed tirzepatide , has not started yet due to possible side effects. Reports she will start taking.  A1C due 02/11/24 Follow-up in 3 months

## 2024-01-12 NOTE — Assessment & Plan Note (Signed)
 pantoprazole  40 mg daily Symptoms are well managed if she uses ginger candy.  Limits red sauce on most meals. Refill sent 11/20/23

## 2024-01-12 NOTE — Assessment & Plan Note (Signed)
 Celebrex  100 mg daily  Has seen ortho, PT, dry needling, does not want surgery.  Seeing weight management to lose weight before surgery.  Hopes that weight loss will improve pain in the knees.  Encouraged daily walking while on her weight loss journey.

## 2024-01-12 NOTE — Assessment & Plan Note (Signed)
 valsartan  160 mg daily Denies chest pain and shortness of breath.  11/11/23: CMP: GFR 84, K+ 4.1 Blood pressure well controlled upon recheck in office.  Refill sent 12/18/23 Follow-up in 3 months with PCP.

## 2024-01-12 NOTE — Progress Notes (Signed)
 Established Patient Office Visit  Subjective   Patient ID: Tina [name], female    DOB: 1955/10/01  Age: 68 y.o. MRN: [id number]  Chief Complaint  Patient presents with   Hypertension    Chronic condition follow-up     HLD: atorvastatin  20 mg daily Tolerating well, no side effects.  07/03/23: LDL 80 Refill sent 12/18/23  GERD: pantoprazole  40 mg daily Symptoms are well managed if she uses ginger candy.  Limits red sauce on most meals. Refill sent 11/20/23  HTN: valsartan  160 mg daily Denies chest pain and shortness of breath.  11/11/23: CMP: GFR 84, K+ 4.1 Blood pressure well controlled upon recheck in office.  Refill sent 12/18/23  Hot flashes: Gabapentin  200 mg HS  Symptoms are not well controlled.  Recommend discussing with PCP.   DM: metformin  500 mg daily- off due to allergic reaction: lip swelling, and rash around mouth, skin cracked.  Discontinued and added as allergy in EMR.  11/11/23: A1C 5.7 [facility]: prescribed tirzepatide , has not started yet due to possible side effects. Reports she will start taking.    Arthritis in knees : Celebrex  100 mg daily  Has seen ortho, PT, dry needling, does not want surgery.  Seeing weight management to lose weight before surgery.  Hopes that weight loss will improve pain in the knees.    Left thumb pain: Hurts to write and all the time. No swelling noted. ROM intact. Will continue to monitor and discuss with PCP at upcoming appointment.   Follow-up with PCP in 3 months for chronic condition follow-up.         Review of Systems  Respiratory:  Negative for shortness of breath.   Cardiovascular:  Negative for chest pain.      Objective:     BP 122/76 (Patient Position: Sitting, Cuff Size: Large)   Pulse 67   Ht 5' (1.524 m)   Wt 228 lb (103.4 kg)   SpO2 99%   BMI 44.53 kg/m    Physical Exam Vitals and nursing note reviewed.  Constitutional:      General: She is not in acute distress.    Appearance:  Normal appearance. She is obese.  Cardiovascular:     Rate and Rhythm: Regular rhythm.     Heart sounds: Normal heart sounds.  Pulmonary:     Effort: Pulmonary effort is normal.     Breath sounds: Normal breath sounds.  Skin:    General: Skin is warm and dry.  Neurological:     General: No focal deficit present.     Mental Status: She is alert. Mental status is at baseline.  Psychiatric:        Mood and Affect: Mood normal.        Behavior: Behavior normal.        Thought Content: Thought content normal.        Judgment: Judgment normal.      No results found for any visits on 01/12/24.    The 10-year ASCVD risk score (Arnett DK, et al., 2019) is: 6.9%    Assessment & Plan:   Problem List Items Addressed This Visit     Essential hypertension - Primary   valsartan  160 mg daily Denies chest pain and shortness of breath.  11/11/23: CMP: GFR 84, K+ 4.1 Blood pressure well controlled upon recheck in office.  Refill sent 12/18/23 Follow-up in 3 months with PCP.       Pure hypercholesterolemia   atorvastatin  20 mg daily Tolerating  well, no side effects.  07/03/23: LDL 80 Defer lipid panel at future follow-up with PCP Refill sent 12/18/23      Prediabetes   metformin  500 mg daily- off due to allergic reaction: lip swelling, and rash around mouth, skin cracked.  Discontinued and added as allergy in EMR.  11/11/23: A1C 5.7 [facility]: prescribed tirzepatide , has not started yet due to possible side effects. Reports she will start taking.  A1C due 02/11/24 Follow-up in 3 months       Primary osteoarthritis of both knees   Celebrex  100 mg daily  Has seen ortho, PT, dry needling, does not want surgery.  Seeing weight management to lose weight before surgery.  Hopes that weight loss will improve pain in the knees.  Encouraged daily walking while on her weight loss journey.       Gastroesophageal reflux disease   pantoprazole  40 mg daily Symptoms are well managed if she  uses ginger candy.  Limits red sauce on most meals. Refill sent 11/20/23      Postmenopausal symptoms    Gabapentin  200 mg HS  Symptoms are not well controlled.  Recommend discussing with PCP.      MDM includes refills sent prior to this visit today.  Agrees with plan of care discussed.  Questions answered.   Return in about 3 months (around 04/13/2024) for chronic conditions .    [name], FNP

## 2024-01-12 NOTE — Assessment & Plan Note (Signed)
 Gabapentin  200 mg HS  Symptoms are not well controlled.  Recommend discussing with PCP.

## 2024-02-02 ENCOUNTER — Encounter (INDEPENDENT_AMBULATORY_CARE_PROVIDER_SITE_OTHER): Payer: Self-pay | Admitting: Family Medicine

## 2024-02-03 ENCOUNTER — Other Ambulatory Visit (INDEPENDENT_AMBULATORY_CARE_PROVIDER_SITE_OTHER): Payer: Self-pay

## 2024-02-03 DIAGNOSIS — G4733 Obstructive sleep apnea (adult) (pediatric): Secondary | ICD-10-CM

## 2024-02-03 MED ORDER — ZEPBOUND 2.5 MG/0.5ML ~~LOC~~ SOAJ: 2.5000 mg | SUBCUTANEOUS | 0 refills | Status: DC

## 2024-02-10 ENCOUNTER — Other Ambulatory Visit (INDEPENDENT_AMBULATORY_CARE_PROVIDER_SITE_OTHER): Payer: Self-pay | Admitting: Family Medicine

## 2024-02-10 DIAGNOSIS — E559 Vitamin D deficiency, unspecified: Secondary | ICD-10-CM

## 2024-02-20 ENCOUNTER — Other Ambulatory Visit: Payer: Self-pay | Admitting: Family Medicine

## 2024-02-20 DIAGNOSIS — M170 Bilateral primary osteoarthritis of knee: Secondary | ICD-10-CM

## 2024-02-20 DIAGNOSIS — K219 Gastro-esophageal reflux disease without esophagitis: Secondary | ICD-10-CM

## 2024-02-26 ENCOUNTER — Other Ambulatory Visit (INDEPENDENT_AMBULATORY_CARE_PROVIDER_SITE_OTHER): Payer: Self-pay | Admitting: Family Medicine

## 2024-02-26 DIAGNOSIS — G4733 Obstructive sleep apnea (adult) (pediatric): Secondary | ICD-10-CM

## 2024-03-08 ENCOUNTER — Ambulatory Visit (INDEPENDENT_AMBULATORY_CARE_PROVIDER_SITE_OTHER): Admitting: Family Medicine

## 2024-03-08 ENCOUNTER — Encounter (INDEPENDENT_AMBULATORY_CARE_PROVIDER_SITE_OTHER): Payer: Self-pay | Admitting: Family Medicine

## 2024-03-08 VITALS — BP 127/81 | HR 63 | Temp 97.8°F | Ht 60.0 in | Wt 229.0 lb

## 2024-03-08 DIAGNOSIS — E669 Obesity, unspecified: Secondary | ICD-10-CM

## 2024-03-08 DIAGNOSIS — E559 Vitamin D deficiency, unspecified: Secondary | ICD-10-CM

## 2024-03-08 DIAGNOSIS — R600 Localized edema: Secondary | ICD-10-CM

## 2024-03-08 DIAGNOSIS — G4733 Obstructive sleep apnea (adult) (pediatric): Secondary | ICD-10-CM | POA: Diagnosis not present

## 2024-03-08 DIAGNOSIS — Z6841 Body Mass Index (BMI) 40.0 and over, adult: Secondary | ICD-10-CM

## 2024-03-08 MED ORDER — VITAMIN D (ERGOCALCIFEROL) 1.25 MG (50000 UNIT) PO CAPS: 50000.0000 [IU] | ORAL_CAPSULE | ORAL | 0 refills | Status: AC

## 2024-03-08 MED ORDER — ZEPBOUND 2.5 MG/0.5ML ~~LOC~~ SOAJ: 2.5000 mg | SUBCUTANEOUS | 0 refills | Status: AC

## 2024-03-08 NOTE — Addendum Note (Signed)
 Addended by: LAFE BAKER CROME on: 03/08/2024 02:54 PM   Modules accepted: Level of Service

## 2024-03-08 NOTE — Progress Notes (Signed)
 "  Office: [phone]  /  Fax: [phone]  WEIGHT SUMMARY AND BIOMETRICS  Anthropometric Measurements Height: 5' (1.524 m) Weight: 229 lb (103.9 kg) BMI (Calculated): 44.72 Weight at Last Visit: 226 lb Weight Lost Since Last Visit: 0 Weight Gained Since Last Visit: 3 lb Starting Weight: 221 lb Total Weight Loss (lbs): 0 lb (0 kg) Peak Weight: 225 lb   Body Composition  Body Fat %: 55 % Fat Mass (lbs): 126.2 lbs Muscle Mass (lbs): 98 lbs Visceral Fat Rating : 20   Other Clinical Data Fasting: no Labs: no Today's Visit #: 10 Starting Date: 07/03/23    Chief Complaint: OBESITY    History of Present Illness Tina [name] is a 69 year old female with obesity who presents for obesity treatment and progress assessment.  She has been following a dietary plan of 1200 calories and 80 or more grams of protein, achieving this approximately 80% of the time. Despite her efforts, she has gained three pounds over the last two months, coinciding with the holiday season. She is not currently engaging in exercise due to knee pain, which affects her ability to maintain physical activity.  She is being treated for vitamin D  deficiency with prescription ergocalciferol , 50,000 IU per week.  She is also being treated for obstructive sleep apnea and uses a CPAP machine. She experiences difficulty sleeping through the night, waking up once or twice to use the bathroom. Her sleep quality was better when she first started using the CPAP.  She is on Zepbound  2.5 mg for weight management. She experiences queasiness the day after taking the medication, but it does not prevent her from eating.  She mentions swelling in her ankles, particularly in one foot, which was swollen upon waking. She denies any recent dietary changes that could account for increased sodium intake and is not currently on a diuretic. She drinks at least two 16-20 oz bottles of water daily, sometimes more.  She describes  her eating habits, noting that she is not often hungry and sometimes struggles to eat three meals a day, especially when waking up late. She manages portion control well with snacks like [employer] and oatmeal cookies.      PHYSICAL EXAM:  Blood pressure 127/81, pulse 63, temperature 97.8 F (36.6 C), height 5' (1.524 m), weight 229 lb (103.9 kg), SpO2 97%. Body mass index is 44.72 kg/m.  DIAGNOSTIC DATA REVIEWED BY MYSELF TODAY:  BMET    Component Value Date/Time   NA 145 (H) 11/11/2023 1202   K 4.1 11/11/2023 1202   CL 107 (H) 11/11/2023 1202   CO2 21 11/11/2023 1202   GLUCOSE 88 11/11/2023 1202   GLUCOSE 94 08/01/2016 0930   BUN 15 11/11/2023 1202   CREATININE 0.77 11/11/2023 1202   CREATININE 0.88 08/01/2016 0930   CALCIUM  9.2 11/11/2023 1202   Lab Results  Component Value Date   HGBA1C 5.7 (H) 11/11/2023   HGBA1C 5.5 07/03/2023   Lab Results  Component Value Date   INSULIN  23.5 11/11/2023   INSULIN  17.1 07/03/2023   Lab Results  Component Value Date   TSH 2.280 07/03/2023   CBC    Component Value Date/Time   WBC 6.0 07/03/2023 1002   RBC 4.23 07/03/2023 1002   HGB 12.9 07/03/2023 1002   HCT 38.2 07/03/2023 1002   PLT 279 07/03/2023 1002   MCV 90 07/03/2023 1002   MCH 30.5 07/03/2023 1002   MCHC 33.8 07/03/2023 1002   RDW 13.3 07/03/2023  1002   Iron Studies No results found for: IRON, TIBC, FERRITIN, IRONPCTSAT Lipid Panel     Component Value Date/Time   CHOL 148 11/11/2023 1202   TRIG 69 11/11/2023 1202   HDL 62 11/11/2023 1202   LDLCALC 72 11/11/2023 1202   Hepatic Function Panel     Component Value Date/Time   PROT 6.8 11/11/2023 1202   ALBUMIN 4.3 11/11/2023 1202   AST 15 11/11/2023 1202   ALT 12 11/11/2023 1202   ALKPHOS 74 11/11/2023 1202   BILITOT 0.4 11/11/2023 1202      Component Value Date/Time   TSH 2.280 07/03/2023 1002   Nutritional Lab Results  Component Value Date   VD25OH 61.0 11/11/2023   VD25OH 40.8  07/03/2023   VD25OH 22 (L) 08/01/2016     Assessment and Plan Assessment & Plan Obesity Management with a dietary plan of 1200 calories and 80+ grams of protein. She reports adherence to the plan about 80% of the time. Weight gain of 3 pounds over the last two months, attributed to holiday period. Exercise is challenging due to knee pain. Zepbound  is being used for weight management, but she reports queasiness 24-48 hours post-administration, which resolves by Wednesday morning. - Continue dietary plan of 1200 calories and 80+ grams of protein. - Encouraged exercise as tolerated. - Continue Zepbound  2.5 mg weekly. - Advised eating small meals every three hours on days with queasiness to manage symptoms.  Vitamin D  deficiency Managed with ergocalciferol  50,000 IU weekly. She requests a refill. - Refilled ergocalciferol  50,000 IU weekly.  Obstructive sleep apnea Managed with CPAP therapy. She reports inconsistent sleep patterns, with increased nocturnal awakenings. No significant improvement with Zepbound  for sleep apnea. - Continue CPAP therapy. - Monitor sleep patterns and nocturnal awakenings.  Bilateral lower extremity edema left (2+)greater than right (1+) Bilateral lower extremity edema, left greater than right, noted upon waking this am. No recent dietary changes or injuries. Amlodipine may contribute to edema. No signs of deep vein thrombosis. Adequate hydration reported. - Increase fluid intake to four 16-20 ounce bottles daily for the next 2-3 days. - Monitor edema and consult primary care if no improvement.      Patients who are on anti-obesity medications are counseled on the importance of maintaining healthy lifestyle habits, including balanced nutrition, regular physical activity, and behavioral modifications,  Medication is an adjunct to, not a replacement for, lifestyle changes and that the long-term success and weight maintenance depend on continued adherence to these  strategies.   Tina [name] was informed of the importance of frequent follow up visits to maximize her success with intensive lifestyle modifications for her obesity and obesity related health conditions as recommended by USPSTF and CMS guidelines  Tina Penton, MD   "

## 2024-03-15 ENCOUNTER — Encounter: Payer: Self-pay | Admitting: Family Medicine

## 2024-03-15 ENCOUNTER — Other Ambulatory Visit: Payer: Self-pay | Admitting: Family Medicine

## 2024-03-15 DIAGNOSIS — E7800 Pure hypercholesterolemia, unspecified: Secondary | ICD-10-CM

## 2024-03-15 DIAGNOSIS — I10 Essential (primary) hypertension: Secondary | ICD-10-CM

## 2024-03-15 MED ORDER — GABAPENTIN 100 MG PO CAPS: 200.0000 mg | ORAL_CAPSULE | Freq: Every day | ORAL | 0 refills | Status: AC

## 2024-03-24 ENCOUNTER — Encounter: Payer: Self-pay | Admitting: Family Medicine

## 2024-03-25 MED ORDER — AMLODIPINE BESYLATE 5 MG PO TABS: 5.0000 mg | ORAL_TABLET | Freq: Every day | ORAL | 0 refills | Status: AC

## 2024-03-25 NOTE — Telephone Encounter (Signed)
 Last med refill for amlodipine  was through a historical provider. Please advise if new prescription is appropriate.

## 2024-04-06 ENCOUNTER — Ambulatory Visit (INDEPENDENT_AMBULATORY_CARE_PROVIDER_SITE_OTHER): Admitting: Family Medicine

## 2024-04-13 ENCOUNTER — Ambulatory Visit: Admitting: Family Medicine

## 2024-12-13 ENCOUNTER — Ambulatory Visit: Admitting: Family Medicine
